# Patient Record
Sex: Female | Born: 1970 | Race: White | Hispanic: Yes | Marital: Married | Smoking: Never smoker
Health system: Southern US, Community
[De-identification: ages and names within clinical notes are randomized; demographics above are authoritative.]

## PROBLEM LIST (undated history)

## (undated) DIAGNOSIS — D649 Anemia, unspecified: Secondary | ICD-10-CM

## (undated) DIAGNOSIS — Z789 Other specified health status: Secondary | ICD-10-CM

## (undated) HISTORY — DX: Anemia, unspecified: D64.9

## (undated) HISTORY — PX: NO PAST SURGERIES: SHX2092

---

## 2006-12-27 ENCOUNTER — Ambulatory Visit: Payer: Self-pay | Admitting: Family Medicine

## 2007-01-19 ENCOUNTER — Ambulatory Visit: Payer: Self-pay | Admitting: Family Medicine

## 2007-02-10 ENCOUNTER — Encounter (INDEPENDENT_AMBULATORY_CARE_PROVIDER_SITE_OTHER): Payer: Self-pay | Admitting: Family Medicine

## 2007-02-10 ENCOUNTER — Ambulatory Visit: Payer: Self-pay | Admitting: Family Medicine

## 2007-03-23 ENCOUNTER — Other Ambulatory Visit: Admission: RE | Admit: 2007-03-23 | Discharge: 2007-03-23 | Payer: Self-pay | Admitting: Obstetrics and Gynecology

## 2007-03-23 ENCOUNTER — Ambulatory Visit: Payer: Self-pay | Admitting: Obstetrics and Gynecology

## 2007-05-19 ENCOUNTER — Ambulatory Visit: Payer: Self-pay | Admitting: Internal Medicine

## 2007-12-26 ENCOUNTER — Ambulatory Visit: Payer: Self-pay | Admitting: Family Medicine

## 2008-05-14 ENCOUNTER — Ambulatory Visit: Payer: Self-pay | Admitting: Family Medicine

## 2009-03-27 ENCOUNTER — Ambulatory Visit: Payer: Self-pay | Admitting: Family Medicine

## 2009-03-27 ENCOUNTER — Encounter (INDEPENDENT_AMBULATORY_CARE_PROVIDER_SITE_OTHER): Payer: Self-pay | Admitting: Family Medicine

## 2011-02-09 NOTE — Group Therapy Note (Signed)
NAMELORRENA, GORANSON          ACCOUNT NO.:  [id number]   MEDICAL RECORD NO.:  [id number]          PATIENT TYPE:  WOC   LOCATION:  [facility]                   FACILITY:  [facility]   PHYSICIAN:  Argentina Donovan, MD        DATE OF BIRTH:  03/04/1971   DATE OF SERVICE:                                  CLINIC NOTE   CHIEF COMPLAINT:  Evaluation of large endocervical polyp.   HISTORY OF PRESENT ILLNESS:  The patient was seen at [facility] on Feb 10, 2007 for a pap smear and physical exam. At the time of her physical  she was noted to have a large cervical polyp or mass. She was referred  to a gynecology clinic for evaluation of this endocervical polyp or  mass. Patient reports she has had right adnexal pain for years which  usually is described as dull, however she had 1 episode in January and 1  episode in April which the pain became sharp, and resolved  spontaneously. Patient denies any irregular bleeding and states that she  had regular periods with her last menstrual period occurring on March 11, 2007. She denies having any gynecologic problems or surgeries. She is  not currently, nor ever has taken oral contraception.   PAST MEDICAL HISTORY:  Significant for anemia for which she takes iron.   GYNECOLOGICAL HISTORY:  Patient is a gravida 3, para 3 with 3 term  pregnancies and no complications.   FAMILY HISTORY:  Significant for cancer unknown type, her mother passed  away at the age of 70. Also significant for maternal uncles with  diabetes.   SOCIAL HISTORY:  Patient lives with her husband and 3 children. She  denies tobacco, alcohol, or drug use.   PHYSICAL EXAMINATION:  VITAL SIGNS: Temperature 98, pulse 84, blood  pressure 118/81, weight 138.2 pounds, height 5 feet 1 inches.  PELVIC EXAM: External genitalia within normal limits. Cervix identified  with approximately 1.5 cm polyp protruding from the os. Polyp was  removed using ring forceps, and twisting. On bimanual exam  patient had  no adnexal tenderness, nor fullness.   ASSESSMENT/PLAN:  The patient is a 40 year old gravida 3, para 3 who  presents for evaluation of a large endocervical polyp.  Cervical polyp removed as above. Polyp will be sent to pathology.  Patient will be notified of pathology results. If patient needs to  return she will be notified at that time. Patient advised she will have  some spotting and possibly cramping following this procedure. Patient  tolerated procedure well and there were no apparent complications.           ______________________________  Argentina Donovan, MD    PR/MEDQ  D:  03/23/2007  T:  03/23/2007  Job:  [id number]

## 2011-12-16 ENCOUNTER — Other Ambulatory Visit: Payer: Self-pay | Admitting: Geriatric Medicine

## 2011-12-22 ENCOUNTER — Other Ambulatory Visit: Payer: Self-pay | Admitting: Geriatric Medicine

## 2011-12-22 ENCOUNTER — Ambulatory Visit
Admission: RE | Admit: 2011-12-22 | Discharge: 2011-12-22 | Disposition: A | Discharge status: Home / Self Care | Payer: No Typology Code available for payment source | Source: Ambulatory Visit | Attending: Geriatric Medicine | Admitting: Geriatric Medicine

## 2011-12-22 IMAGING — US US ABDOMEN LIMITED
1 series · 14 of 25 positions shown · non-contrast
Comparison: None.

CLINICAL DATA: Positive Murphy's sign on physical exam.

LIMITED ABDOMINAL ULTRASOUND - RIGHT UPPER QUADRANT

[Series 1: us abdomen limited · 0.24mm/px · 14 of 47 slices shown]
[im 1/47]
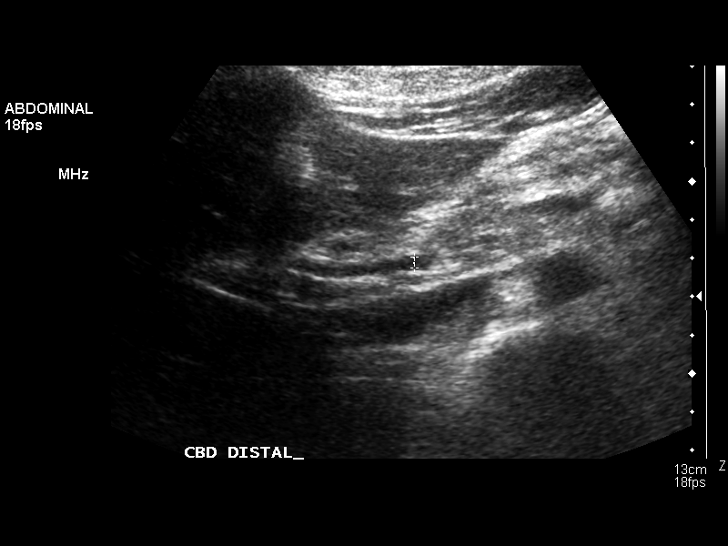
[im 4/47]
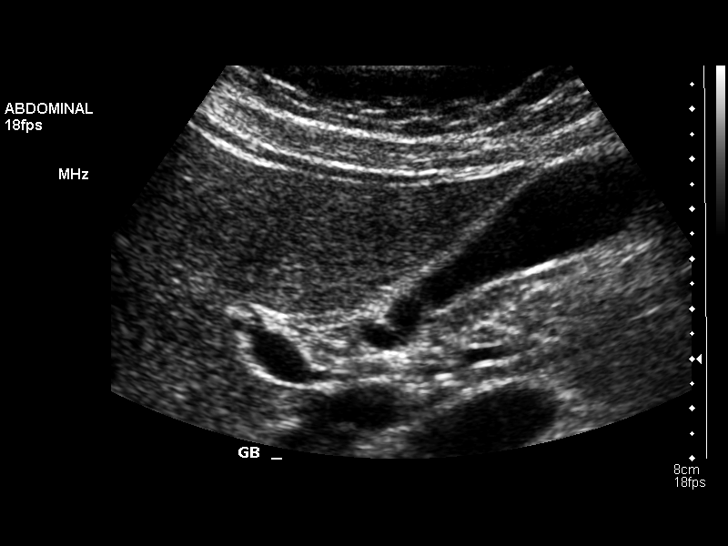
[im 8/47]
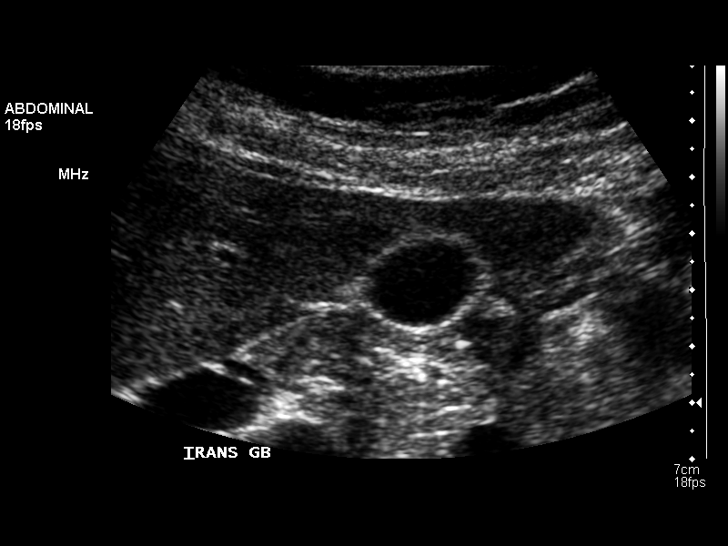
[im 12/47]
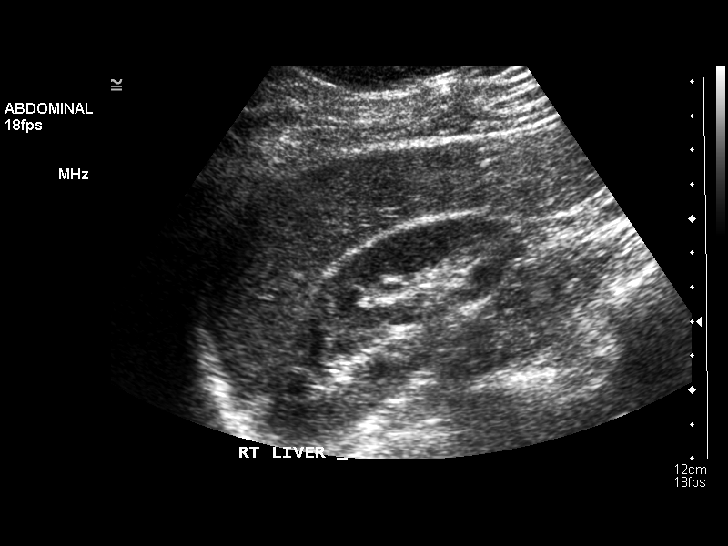
[im 16/47]
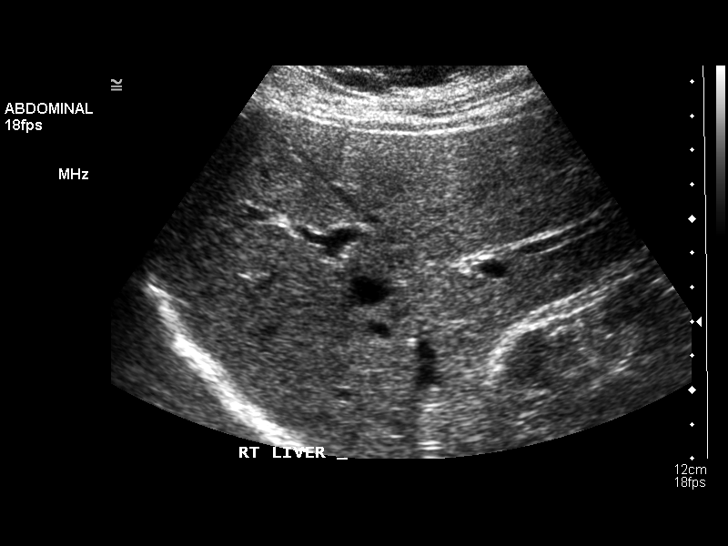
[im 18/47]
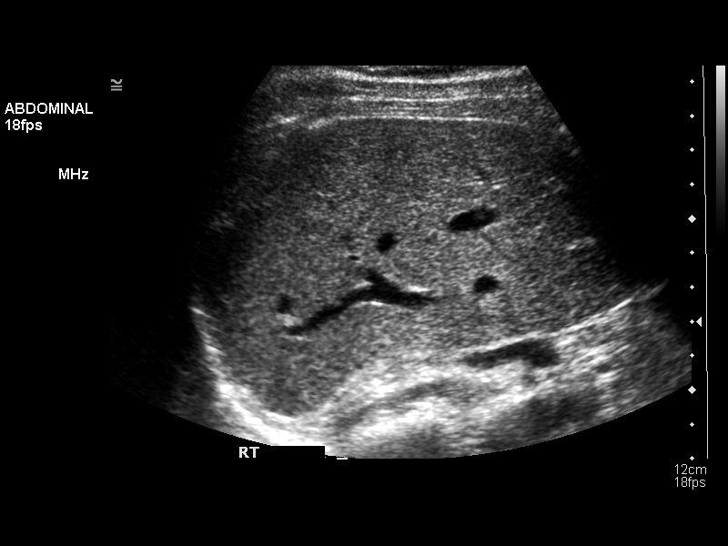
[im 22/47]
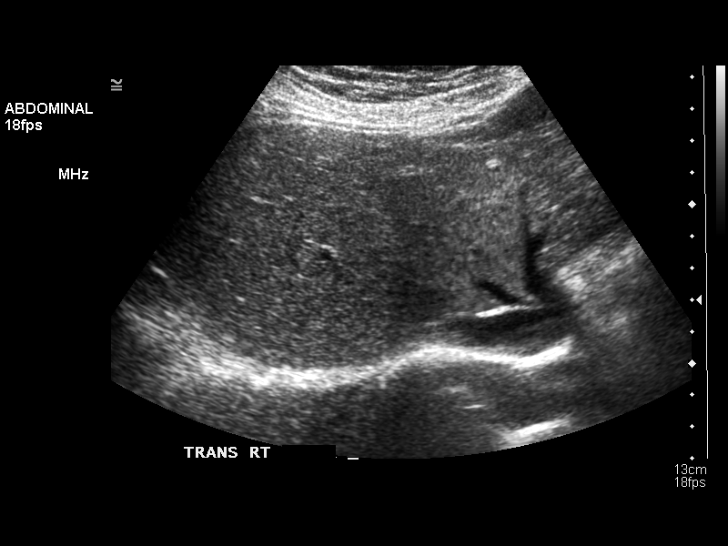
[im 25/47]
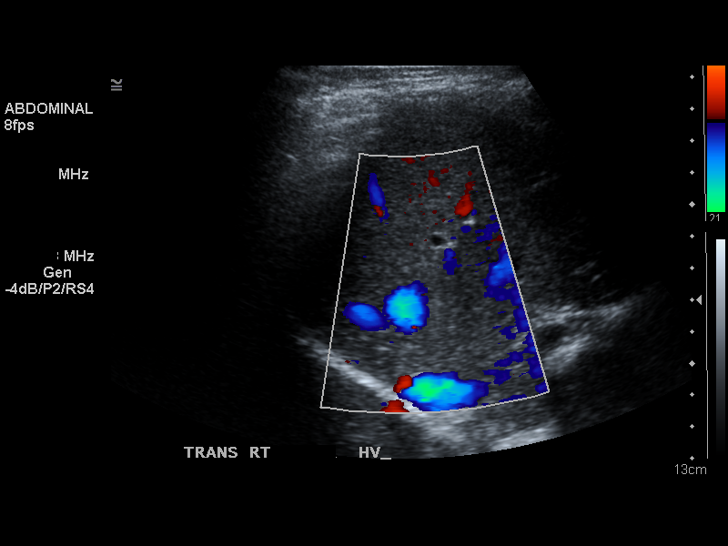
[im 29/47]
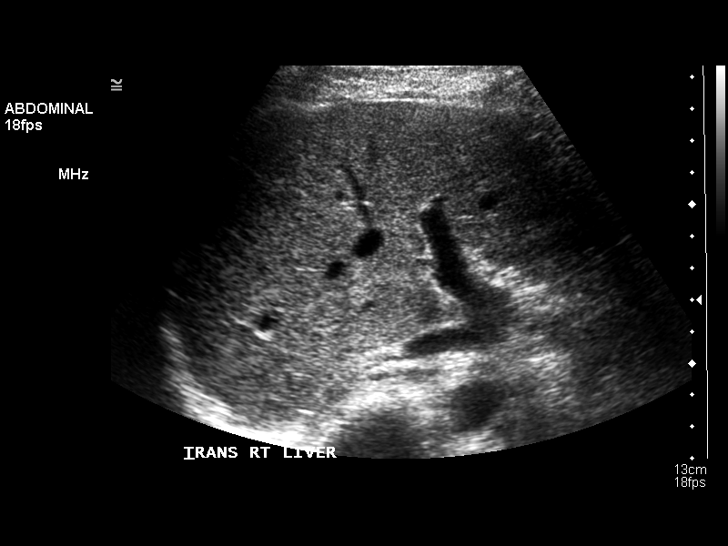
[im 31/47]
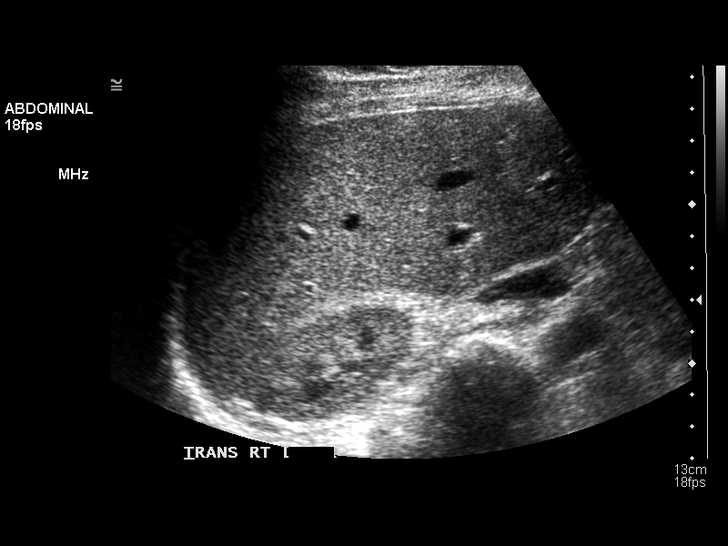
[im 35/47]
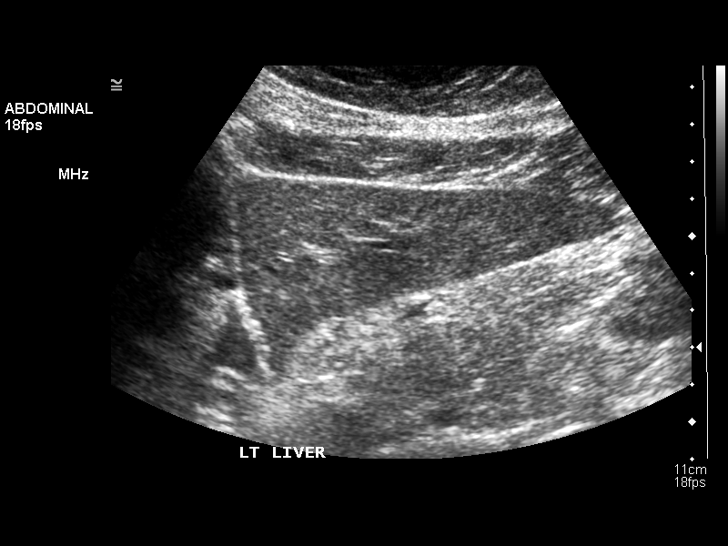
[im 39/47]
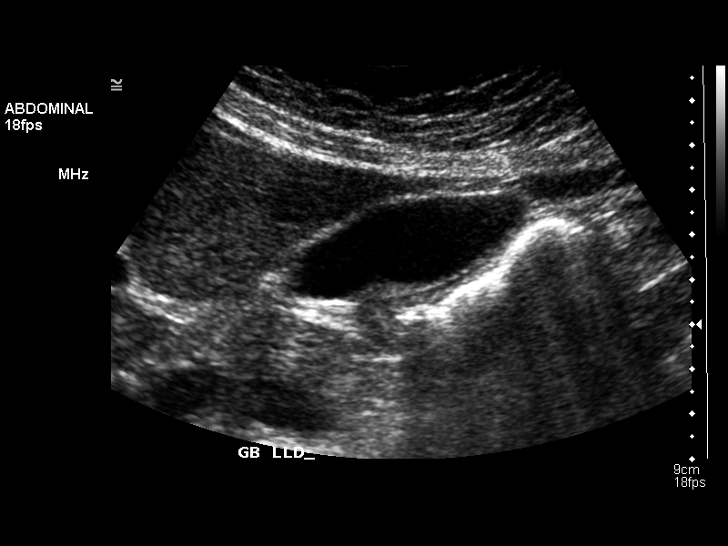
[im 43/47]
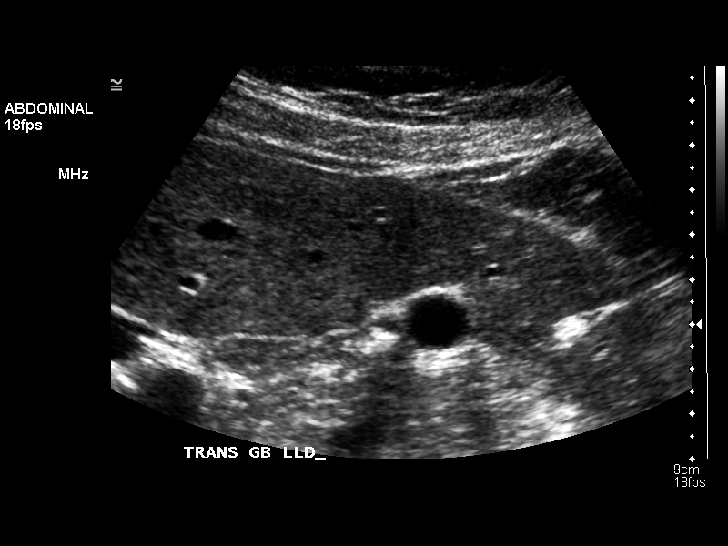
[im 47/47]
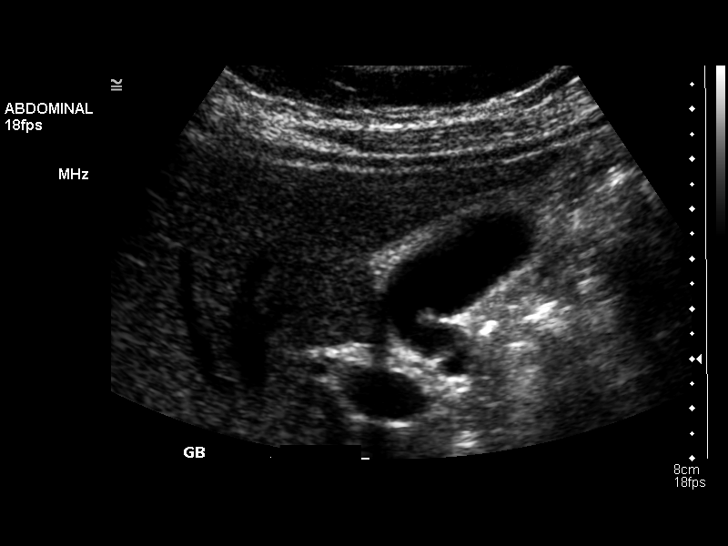

[14 of 25 positions shown; findings below may reference images not displayed]

FINDINGS: Gallbladder:  No stones or wall thickening.  Negative sonographic
Friptu.

Common bile duct:  Normal caliber, 3 mm.

Liver:  Normal size and echotexture.  No focal abnormality.
IMPRESSION: Unremarkable right upper quadrant ultrasound.

## 2014-09-27 NOTE — L&D Delivery Note (Signed)
Delivery Note At 02:16 PM a viable female was delivered via Vaginal, Spontaneous Delivery (Presentation: VERTEX). APGAR: 9, 9; weight 7-14.  Placenta status: Intact, Spontaneous. Cord: 3 vessels with the following complications: None. Cord pH: NA.  Anesthesia: Epidural  Episiotomy: None Lacerations: None Suture Repair: N/A Est. Blood Loss (mL): 258 ml   Mom to postpartum. Baby to Couplet care / Skin to Skin. Placenta to: Birthing Suites Feeding: Breast & Bottle Circ: NA Contraception: IUD

## 2014-12-02 ENCOUNTER — Other Ambulatory Visit (HOSPITAL_COMMUNITY): Payer: Self-pay | Admitting: Nurse Practitioner

## 2014-12-02 DIAGNOSIS — Z3689 Encounter for other specified antenatal screening: Secondary | ICD-10-CM

## 2014-12-02 LAB — OB RESULTS CONSOLE HIV ANTIBODY (ROUTINE TESTING): HIV: NONREACTIVE

## 2014-12-02 LAB — OB RESULTS CONSOLE RUBELLA ANTIBODY, IGM: Rubella: IMMUNE

## 2014-12-02 LAB — OB RESULTS CONSOLE RPR: RPR: NONREACTIVE

## 2014-12-02 LAB — OB RESULTS CONSOLE ANTIBODY SCREEN: ANTIBODY SCREEN: NEGATIVE

## 2014-12-02 LAB — OB RESULTS CONSOLE ABO/RH: RH Type: POSITIVE

## 2014-12-02 LAB — OB RESULTS CONSOLE HEPATITIS B SURFACE ANTIGEN: Hepatitis B Surface Ag: NEGATIVE

## 2014-12-02 LAB — OB RESULTS CONSOLE GC/CHLAMYDIA
Chlamydia: NEGATIVE
Gonorrhea: NEGATIVE

## 2014-12-17 ENCOUNTER — Ambulatory Visit (HOSPITAL_COMMUNITY): Payer: Self-pay

## 2014-12-19 ENCOUNTER — Ambulatory Visit (HOSPITAL_COMMUNITY)
Admission: RE | Admit: 2014-12-19 | Discharge: 2014-12-19 | Disposition: A | Discharge status: Home / Self Care | Payer: Medicaid Other | Source: Ambulatory Visit | Attending: Nurse Practitioner | Admitting: Nurse Practitioner

## 2014-12-19 ENCOUNTER — Encounter (HOSPITAL_COMMUNITY): Payer: Self-pay

## 2014-12-19 ENCOUNTER — Other Ambulatory Visit (HOSPITAL_COMMUNITY): Payer: Self-pay | Admitting: Nurse Practitioner

## 2014-12-19 ENCOUNTER — Other Ambulatory Visit (HOSPITAL_COMMUNITY): Payer: Self-pay | Admitting: Obstetrics & Gynecology

## 2014-12-19 DIAGNOSIS — O09522 Supervision of elderly multigravida, second trimester: Secondary | ICD-10-CM | POA: Diagnosis not present

## 2014-12-19 DIAGNOSIS — Z36 Encounter for antenatal screening of mother: Secondary | ICD-10-CM | POA: Insufficient documentation

## 2014-12-19 DIAGNOSIS — Z3689 Encounter for other specified antenatal screening: Secondary | ICD-10-CM

## 2014-12-19 DIAGNOSIS — O0932 Supervision of pregnancy with insufficient antenatal care, second trimester: Secondary | ICD-10-CM | POA: Insufficient documentation

## 2014-12-19 DIAGNOSIS — O09529 Supervision of elderly multigravida, unspecified trimester: Secondary | ICD-10-CM | POA: Insufficient documentation

## 2014-12-19 IMAGING — US US OB DETAIL+14 WK
1 series · 12 of 28 positions shown · non-contrast
Comparison: none

[Series 1: us ob detail+14 wk · 0.26mm/px · 12 of 71 slices shown]
[im 3/71]
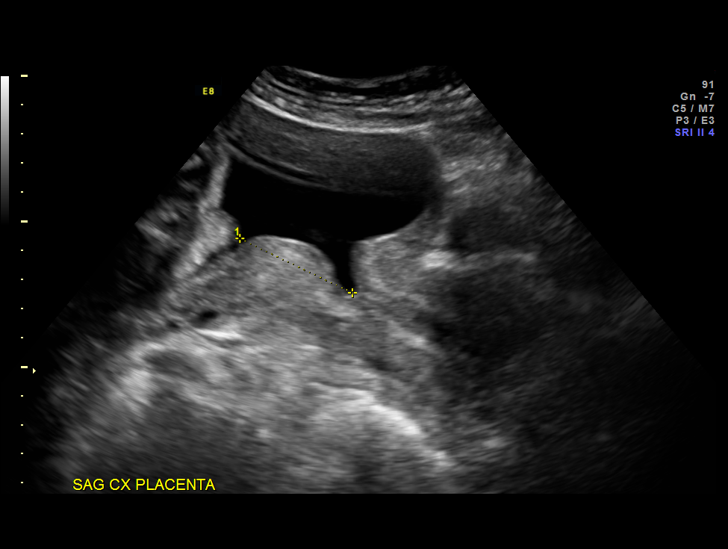
[im 8/71]
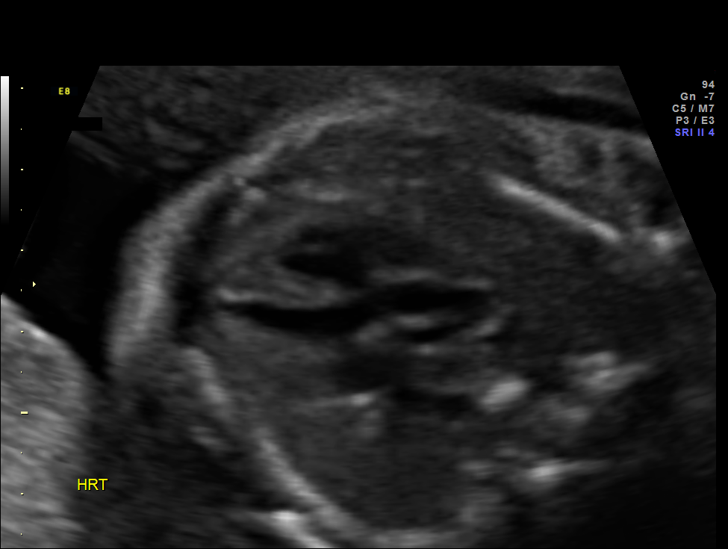
[im 13/71]
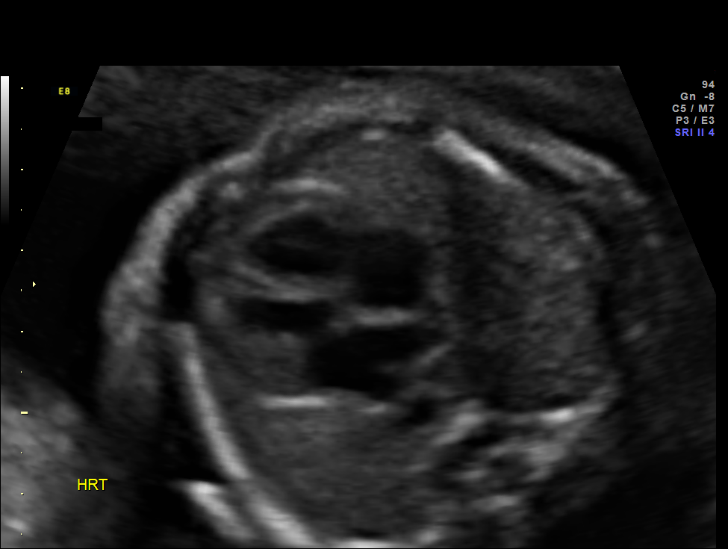
[im 21/71]
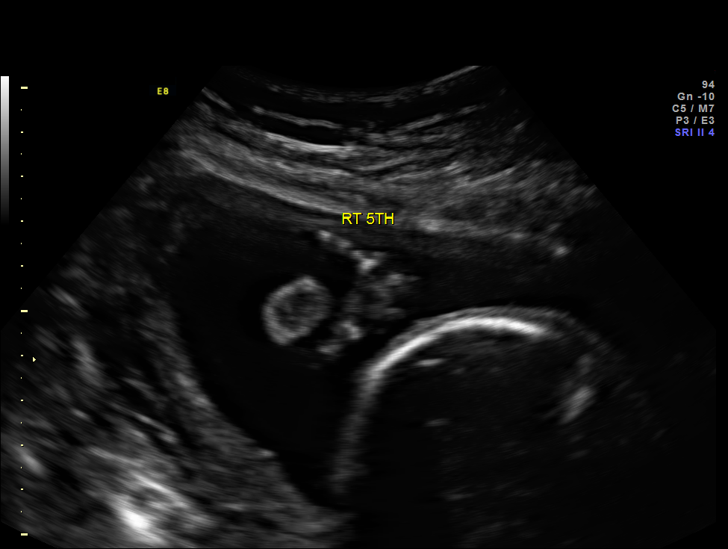
[im 26/71]
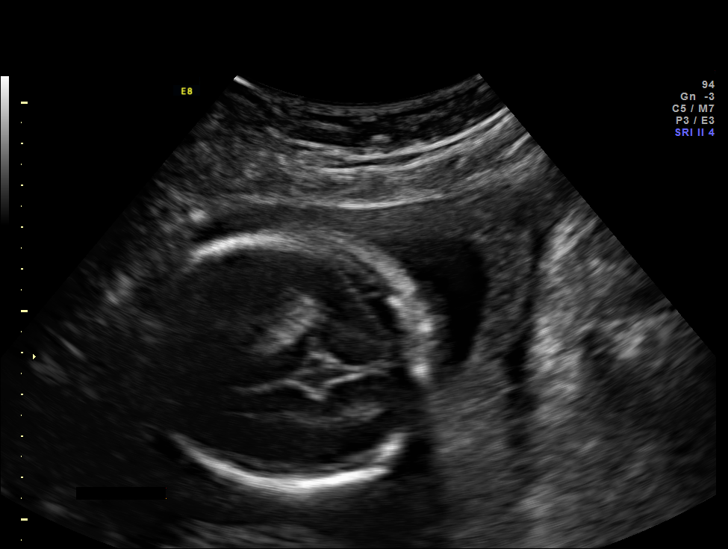
[im 32/71]
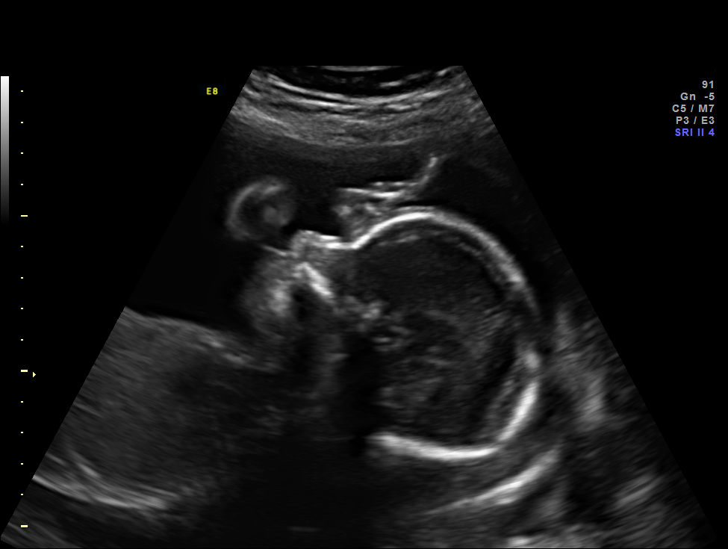
[im 39/71]
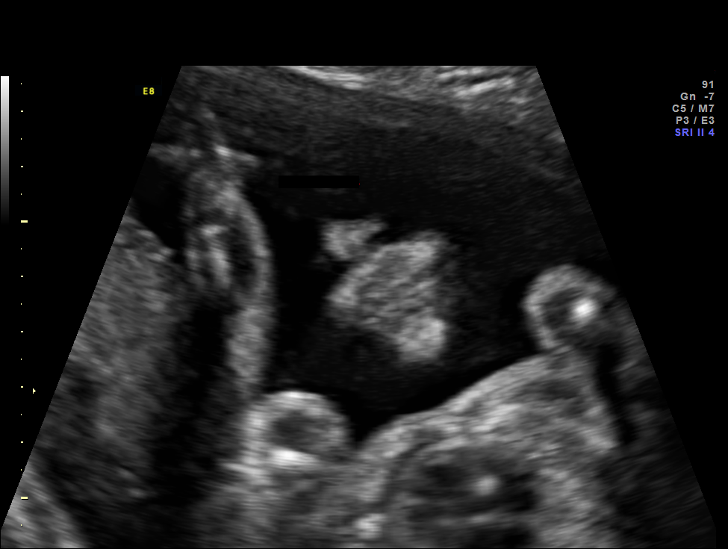
[im 45/71]
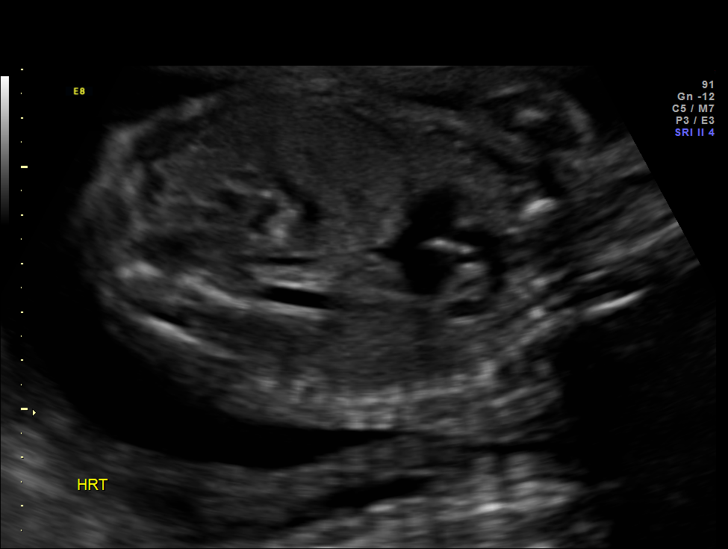
[im 50/71]
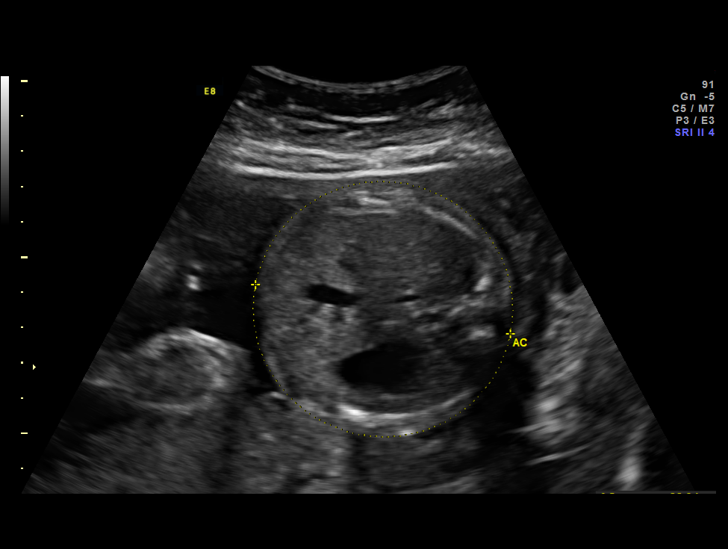
[im 58/71]
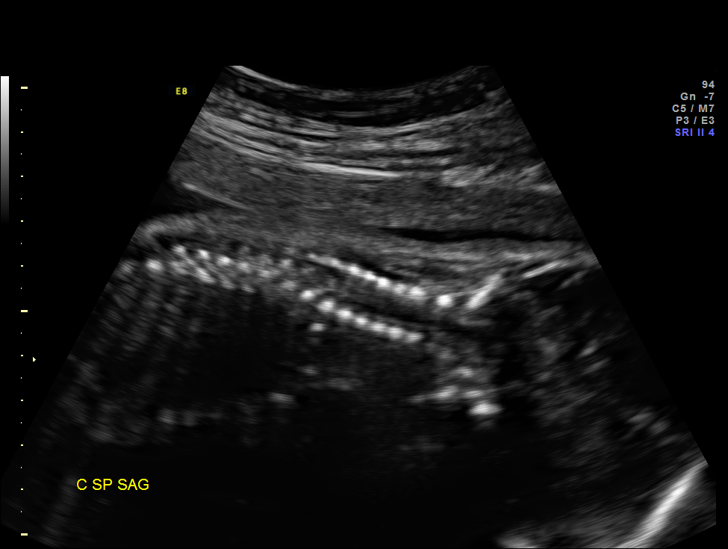
[im 63/71]
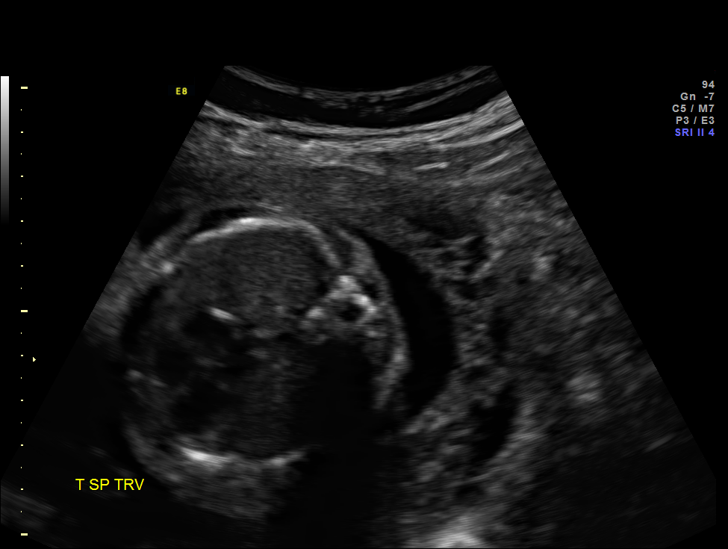
[im 68/71]
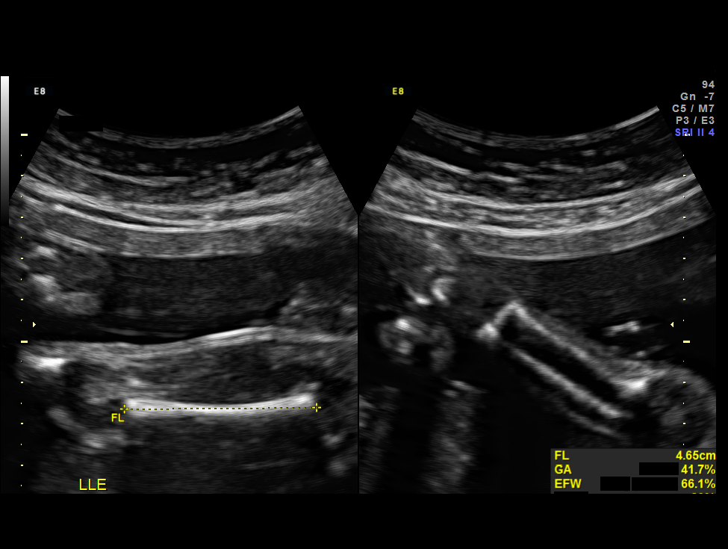

[12 of 28 positions shown; findings below may reference images not displayed]

OBSTETRICS REPORT
                      (Signed Final [DATE] [DATE])

Service(s) Provided

 US OB DETAIL + 14 WK                                  76811.0
Indications

 25 weeks gestation of pregnancy
 Detailed fetal anatomic survey                        Z36
 Advanced maternal age multigravida 35+, second        [G4]
 trimester
 No or Little Prenatal Care                            [G4]
Fetal Evaluation

 Num Of Fetuses:    1
 Fetal Heart Rate:  155                          bpm
 Cardiac Activity:  Observed
 Presentation:      Cephalic
 Placenta:          Posterior, above cervical
                    os
 P. Cord            Visualized, central
 Insertion:

 Amniotic Fluid
 AFI FV:      Subjectively within normal limits
                                             Larg Pckt:     6.0  cm
Biometry

 BPD:     62.5  mm     G. Age:  25w 2d                CI:         75.5   70 - 86
 OFD:     82.8  mm                                    FL/HC:      19.9   18.7 -

 HC:     233.7  mm     G. Age:  25w 3d       32  %    HC/AC:      1.02   1.04 -

 AC:     228.2  mm     G. Age:  27w 2d       91  %    FL/BPD:     74.6   71 - 87
 FL:      46.6  mm     G. Age:  25w 4d       43  %    FL/AC:      20.4   20 - 24
 HUM:     42.4  mm     G. Age:  25w 3d       48  %
 CER:     29.5  mm     G. Age:  26w 2d       67  %

 Est. FW:     921  gm           2 lb     74  %
Gestational Age

 LMP:           25w 2d        Date:  [DATE]                 EDD:   [DATE]
 U/S Today:     25w 6d                                        EDD:   [DATE]
 Best:          25w 2d     Det. By:  LMP  ([DATE])          EDD:   [DATE]
Anatomy

 Cranium:          Appears normal         Aortic Arch:      Appears normal
 Fetal Cavum:      Appears normal         Ductal Arch:      Appears normal
 Ventricles:       Appears normal         Diaphragm:        Appears normal
 Choroid Plexus:   Appears normal         Stomach:          Appears normal, left
                                                            sided
 Cerebellum:       Appears normal         Abdomen:          Appears normal
 Posterior Fossa:  Appears normal         Abdominal Wall:   Appears nml (cord
                                                            insert, abd wall)
 Nuchal Fold:      Not applicable (>20    Cord Vessels:     Appears normal (3
                   wks GA)                                  vessel cord)
 Face:             Appears normal         Kidneys:          Appear normal
                   (orbits and profile)
 Lips:             Appears normal         Bladder:          Appears normal
 Heart:            Appears normal         Spine:            Appears normal
                   (4CH, axis, and
                   situs)
 RVOT:             Appears normal         Lower             Appears normal
                                          Extremities:
 LVOT:             Appears normal         Upper             Appears normal
                                          Extremities:

 Other:  Fetus appears to be a female. Heels and 5th digit visualized. Nasal
         bone visualized.
Targeted Anatomy

 Fetal Central Nervous System
 Cisterna Magna:
Cervix Uterus Adnexa

 Cervical Length:    4.4      cm

 Cervix:       Normal appearance by transabdominal scan.

 Left Ovary:    Within normal limits.
 Right Ovary:   Within normal limits.
 Adnexa:     No abnormality visualized.
Impression

 Single IUP at 25w 2d
 Advanced maternal age > 40
 Normal fetal anatomic survey
 Fetal growth is appropriate (74th %tile)
 Normal amniotic fluid volume
Recommendations

 Recommend follow-up ultrasound examination in 6 weeks for
 growth due to advanced maternal age
 Antenatal testing beginning at 36 weeks
 Recommend delivery by EDD

## 2014-12-19 NOTE — Progress Notes (Addendum)
Genetic Counseling  High-Risk Gestation Note  Appointment Date:  12/19/2014 Referred By: Trina AoBaker, Sandra K, NP Date of Birth:  [birth year]   Pregnancy History: [id number] Estimated Date of Delivery: 04/01/15 Estimated Gestational Age: 375w2d Attending: Alpha GulaPaul Whitecar, MD   Danielle Cole was seen for genetic counseling because of a maternal age of 44 y.o..  [facility]English medical interpreter, Olegario MessierKathy, provided interpretation for today's visit.   She was counseled regarding maternal age and the association with risk for chromosome conditions due to nondisjunction with aging of the ova.   We reviewed chromosomes, nondisjunction, and the associated 1 in 2431 risk for fetal aneuploidy related to a maternal age of 44 y.o. at delivery.  She was counseled that the risk for aneuploidy decreases as gestational age increases, accounting for those pregnancies which spontaneously abort.  We specifically discussed Down syndrome (trisomy 8321), trisomies 6613 and 5318, and sex chromosome aneuploidies (47,XXX and 47,XXY) including the common features and prognoses of each.   We reviewed available screening options including noninvasive prenatal screening (NIPS)/cell free DNA (cfDNA) testing and detailed ultrasound.  She was counseled that screening tests are used to modify a patient's a priori risk for aneuploidy, typically based on age. This estimate provides a pregnancy specific risk assessment. We reviewed the benefits and limitations of each option. Specifically, we discussed the conditions for which each test screens, the detection rates, and false positive rates of each. She was also counseled regarding diagnostic testing via amniocentesis. We reviewed the approximate 1 in 300-500 risk for complications for amniocentesis, including spontaneous preterm labor and delivery. After consideration of all the options, she declined NIPS and amniocentesis.   A detailed ultrasound was performed today. The ultrasound report  will be sent under separate cover. There were no visualized fetal anomalies or markers suggestive of aneuploidy. Diagnostic testing was declined today.  She understands that screening tests cannot rule out all birth defects or genetic syndromes. The patient was advised of this limitation and states she still does not want additional testing at this time.   Danielle Cole was provided with written information regarding sickle cell anemia (SCA) including the carrier frequency and incidence in the Hispanic population, the availability of carrier testing and prenatal diagnosis if indicated.  In addition, we discussed that hemoglobinopathies are routinely screened for as part of the [place] newborn screening panel.  She previously had hemoglobin electrophoresis, which indicated the presence of normal adult hemoglobin.  Both family histories were reviewed and found to be contributory for Down syndrome for the nephew of the father of the pregnancy (his sister's son). She reportedly was told during her pregnancy that he had a hole in his heart and was likely going to have Down syndrome. This relative is currently 43 years old and does not speak. He was described to have facial features similar to other individuals with Down syndrome, particularly with his eyes. He was described to be a late walker and is in special classes in school. He did have a hole in the heart but did not require surgical correction. We discussed that 95% of cases of Down syndrome are not inherited and are the result of non-disjunction.  Three to 4% of cases of Down syndrome are the result of a translocation involving chromosome #21.  We discussed the option of chromosome analysis to determine if an individual is a carrier of a balanced translocation involving chromosome #21.  If an individual carries a balanced translocation involving chromosome #21, then the chance to have  a baby with Down syndrome would be greater than the maternal  age-related risk.  Given the reported family history, we discussed that this relative's Down syndrome was most likely sporadic. Additional information regarding the etiology may alter recurrence risk assessment.   Additionally, Danielle Cole reported that her oldest sister had a daughter who was not able to walk. This daughter reportedly got sick and died at age 26 years. Limited information was known by the patient regarding her condition. The patient reported that this sister has 10 additional children who are all healthy. We discussed that without further information regarding the provided family history, an accurate genetic risk cannot be calculated. However, given the reported family history, recurrence risk for the current pregnancy for something similar is likely low. Further genetic counseling is warranted if more information is obtained.  Danielle Cole denied exposure to environmental toxins or chemical agents. She denied the use of alcohol, tobacco or street drugs. She denied significant viral illnesses during the course of her pregnancy. Her medical and surgical histories were noncontributory.   I counseled Danielle Cole regarding the above risks and available options.  The approximate face-to-face time with the genetic counselor was 45 minutes.  Quinn Plowman, MS,  Certified Genetic Counselor 12/19/2014

## 2014-12-23 ENCOUNTER — Other Ambulatory Visit (HOSPITAL_COMMUNITY): Payer: Self-pay | Admitting: Maternal and Fetal Medicine

## 2014-12-23 DIAGNOSIS — O0933 Supervision of pregnancy with insufficient antenatal care, third trimester: Secondary | ICD-10-CM

## 2014-12-23 DIAGNOSIS — O09523 Supervision of elderly multigravida, third trimester: Secondary | ICD-10-CM

## 2015-01-30 ENCOUNTER — Other Ambulatory Visit (HOSPITAL_COMMUNITY): Payer: Self-pay | Admitting: Maternal and Fetal Medicine

## 2015-01-30 ENCOUNTER — Ambulatory Visit (HOSPITAL_COMMUNITY)
Admission: RE | Admit: 2015-01-30 | Discharge: 2015-01-30 | Disposition: A | Discharge status: Home / Self Care | Payer: Medicaid Other | Source: Ambulatory Visit | Attending: Nurse Practitioner | Admitting: Nurse Practitioner

## 2015-01-30 DIAGNOSIS — O09523 Supervision of elderly multigravida, third trimester: Secondary | ICD-10-CM | POA: Insufficient documentation

## 2015-01-30 DIAGNOSIS — O0933 Supervision of pregnancy with insufficient antenatal care, third trimester: Secondary | ICD-10-CM

## 2015-01-30 DIAGNOSIS — Z3A31 31 weeks gestation of pregnancy: Secondary | ICD-10-CM | POA: Insufficient documentation

## 2015-02-27 ENCOUNTER — Ambulatory Visit (HOSPITAL_COMMUNITY)
Admission: RE | Admit: 2015-02-27 | Discharge: 2015-02-27 | Disposition: A | Discharge status: Home / Self Care | Payer: Medicaid Other | Source: Ambulatory Visit | Attending: Nurse Practitioner | Admitting: Nurse Practitioner

## 2015-02-27 ENCOUNTER — Encounter (HOSPITAL_COMMUNITY): Payer: Self-pay

## 2015-02-27 VITALS — BP 123/79 | HR 82 | Wt 161.8 lb

## 2015-02-27 DIAGNOSIS — Z3A35 35 weeks gestation of pregnancy: Secondary | ICD-10-CM | POA: Insufficient documentation

## 2015-02-27 DIAGNOSIS — O09523 Supervision of elderly multigravida, third trimester: Secondary | ICD-10-CM | POA: Insufficient documentation

## 2015-02-27 DIAGNOSIS — O0933 Supervision of pregnancy with insufficient antenatal care, third trimester: Secondary | ICD-10-CM

## 2015-02-27 HISTORY — DX: Other specified health status: Z78.9

## 2015-02-27 IMAGING — US US OB FOLLOW-UP
1 series · 12 of 28 positions shown · non-contrast
Comparison: none

[Series 1: us ob follow-up · 0.28mm/px · 12 of 31 slices shown]
[im 2/31]
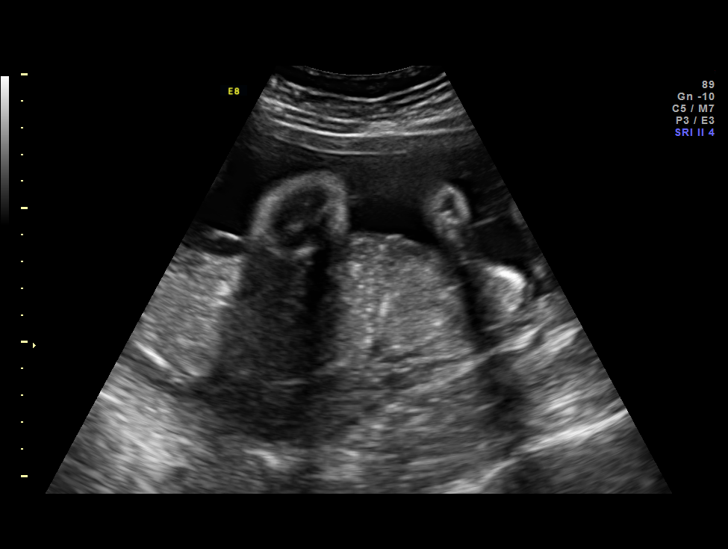
[im 4/31]
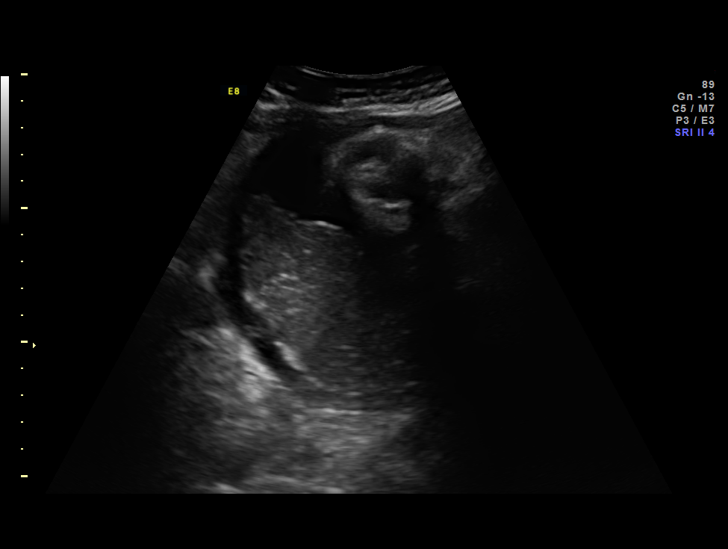
[im 6/31]
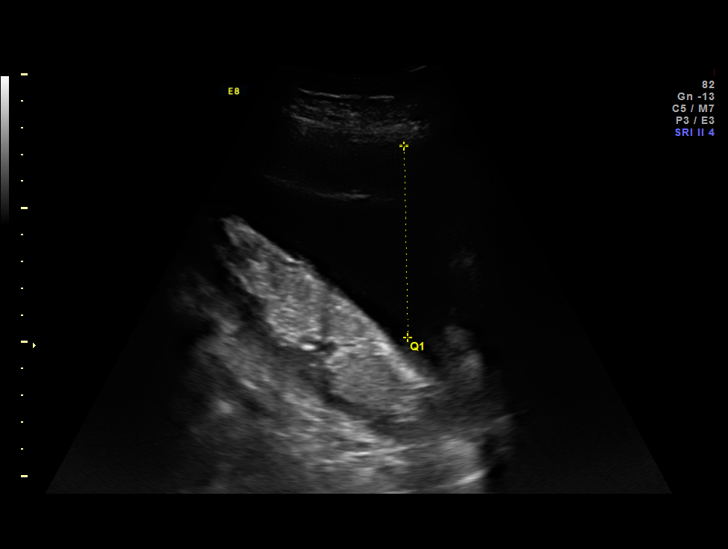
[im 9/31]
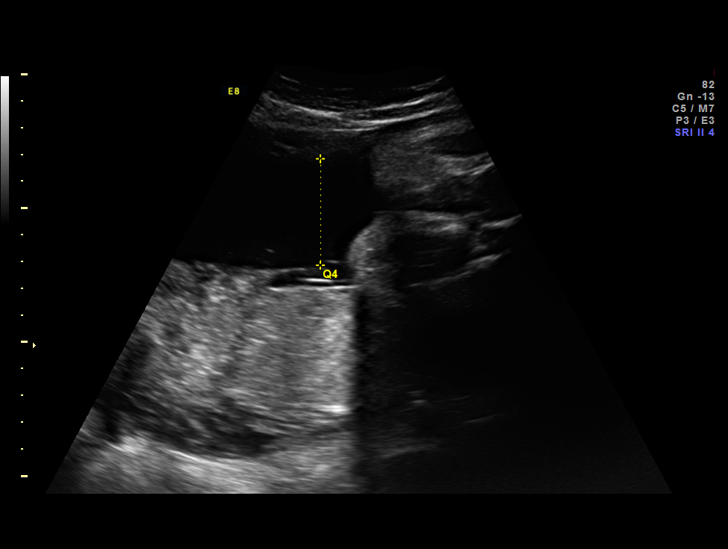
[im 12/31]
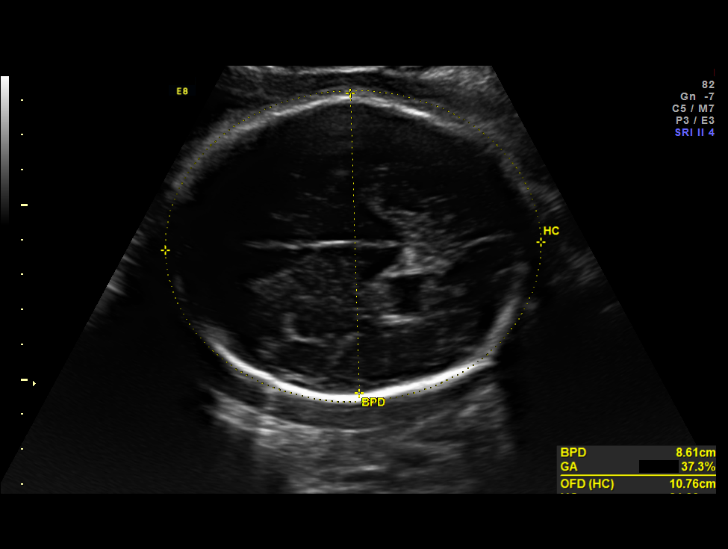
[im 14/31]
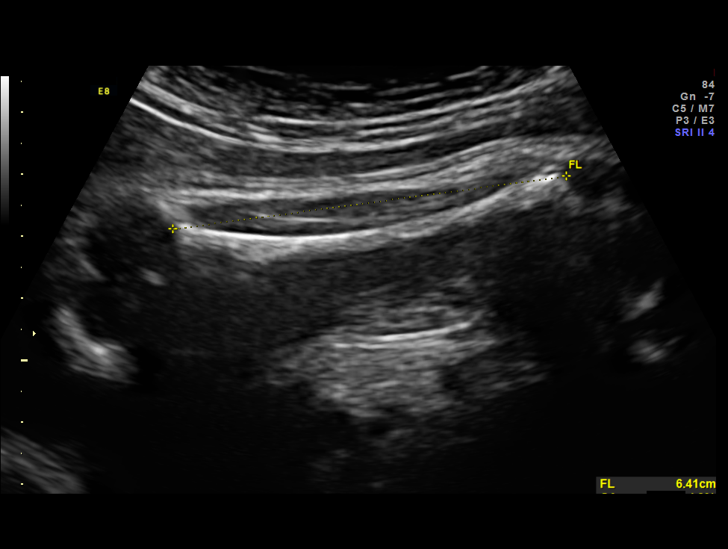
[im 17/31]
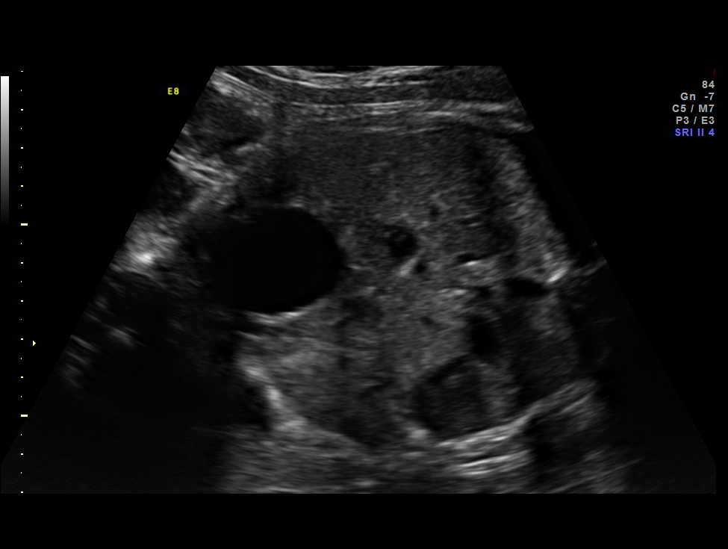
[im 19/31]
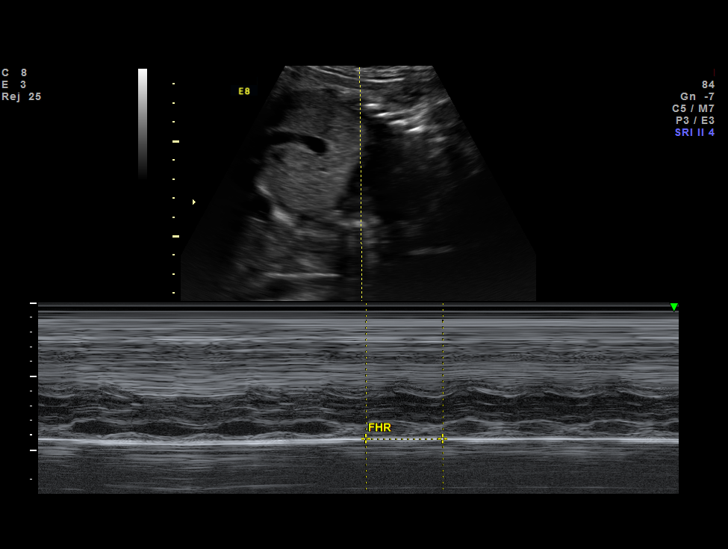
[im 22/31]
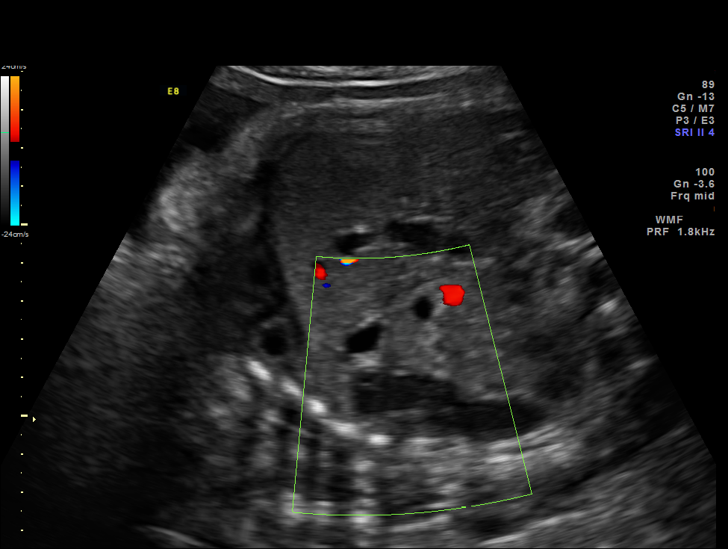
[im 25/31]
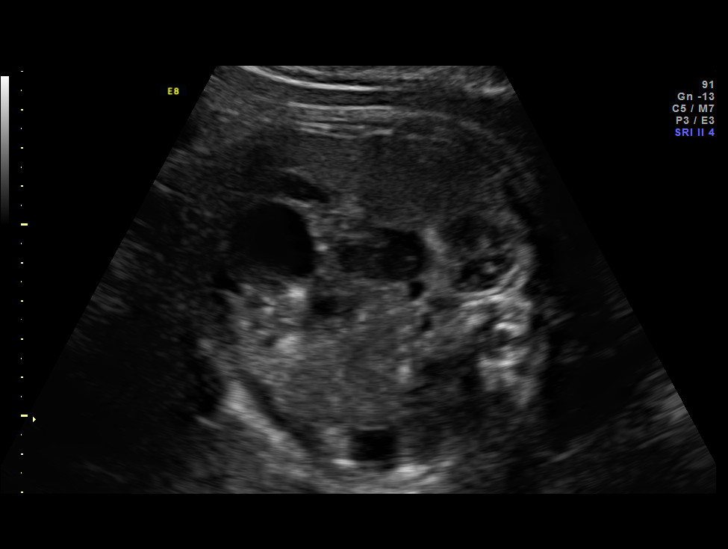
[im 27/31]
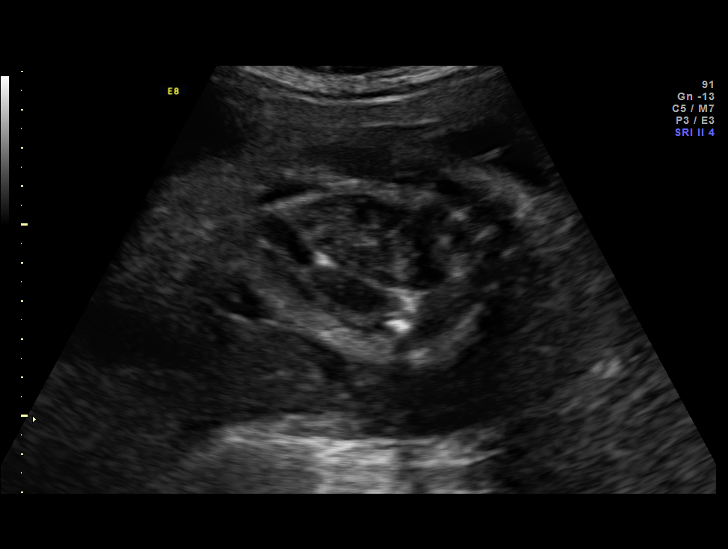
[im 29/31]
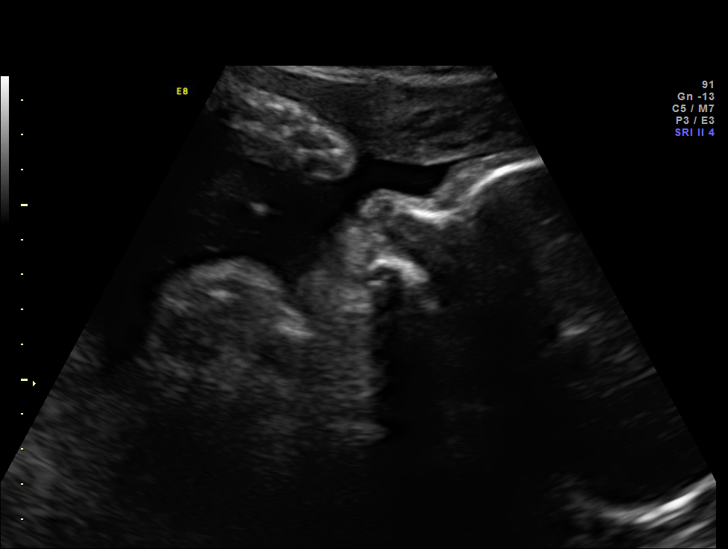

[12 of 28 positions shown; findings below may reference images not displayed]

OBSTETRICS REPORT
(Signed Final 02/27/2015 [DATE])

Service(s) Provided

US OB FOLLOW UP                                       76816.1
Indications

Advanced maternal age multigravida 35+, third
trimester
No or Little Prenatal Care
35 weeks gestation of pregnancy
Fetal Evaluation

Num Of Fetuses:    1
Fetal Heart Rate:  131                          bpm
Cardiac Activity:  Observed
Presentation:      Cephalic
Placenta:          Posterior, above cervical
os
P. Cord            Previously Visualized
Insertion:

Amniotic Fluid
AFI FV:      Subjectively within normal limits
AFI Sum:     18      cm       67  %Tile     Larg Pckt:    7.16  cm
RUQ:   7.16    cm   RLQ:    3.98   cm    LUQ:   5.02    cm   LLQ:    1.84   cm
Biometry

BPD:     85.9  mm     G. Age:  34w 5d                CI:         78.9   70 - 86
OFD:    108.9  mm                                    FL/HC:      20.6   20.1 -
22.3
HC:     311.2  mm     G. Age:  34w 6d       11  %    HC/AC:      0.92   0.93 -
1.11
AC:     340.1  mm     G. Age:  37w 6d     > 97  %    FL/BPD:     74.7   71 - 87
FL:      64.2  mm     G. Age:  33w 1d        5  %    FL/AC:      18.9   20 - 24
HUM:     57.9  mm     G. Age:  33w 4d       32  %

Est. FW:    0877  gm      6 lb 4 oz     77  %
Gestational Age

LMP:           35w 2d        Date:  06/25/14                 EDD:   04/01/15
U/S Today:     35w 1d                                        EDD:   04/02/15
Best:          35w 2d     Det. By:  LMP  (06/25/14)          EDD:   04/01/15
Anatomy

Cranium:          Previously seen        Aortic Arch:      Previously seen
Fetal Cavum:      Previously seen        Ductal Arch:      Previously seen
Ventricles:       Appears normal         Diaphragm:        Appears normal
Choroid Plexus:   Previously seen        Stomach:          Appears normal, left
sided
Cerebellum:       Previously seen        Abdomen:          Previously seen
Posterior Fossa:  Previously seen        Abdominal Wall:   Previously seen
Nuchal Fold:      Not applicable (>20    Cord Vessels:     Previously seen
wks GA)
Face:             Orbits and profile     Kidneys:          Appear normal
previously seen
Lips:             Previously seen        Bladder:          Appears normal
Heart:            Appears normal         Spine:            Previously seen
(4CH, axis, and
situs)
RVOT:             Previously seen        Lower             Previously seen
Extremities:
LVOT:             Previously seen        Upper             Previously seen
Extremities:

Other:  Fetus appears to be a female. Heels and 5th digit previously
visualized.
Cervix Uterus Adnexa

Cervix:       Not visualized (advanced GA >73wks)
Impression

Single IUP at 35w 2d
Advanced maternal age > 40
Interval growth is appropriate (77th %tile).  The AC measures
> 97th %tile (had normal GDM screen)
Normal amniotic fluid volume
Recommendations

Recommend antenatal testing - 2x weekly NSTs with weekly
AFIs
Recommend delivery by EDD
Follow-up ultrasounds as clinically indicated.

## 2015-02-27 NOTE — ED Notes (Signed)
Spanish interpreter Mariel Gallego with pt. 

## 2015-03-07 ENCOUNTER — Other Ambulatory Visit (HOSPITAL_COMMUNITY): Payer: Self-pay | Admitting: Nurse Practitioner

## 2015-03-07 ENCOUNTER — Ambulatory Visit (HOSPITAL_COMMUNITY)
Admission: RE | Admit: 2015-03-07 | Discharge: 2015-03-07 | Disposition: A | Discharge status: Home / Self Care | Payer: Self-pay | Source: Ambulatory Visit | Attending: Nurse Practitioner | Admitting: Nurse Practitioner

## 2015-03-07 ENCOUNTER — Encounter (HOSPITAL_COMMUNITY): Payer: Self-pay

## 2015-03-07 ENCOUNTER — Ambulatory Visit (HOSPITAL_COMMUNITY): Payer: Medicaid Other

## 2015-03-07 DIAGNOSIS — O09529 Supervision of elderly multigravida, unspecified trimester: Secondary | ICD-10-CM | POA: Insufficient documentation

## 2015-03-07 DIAGNOSIS — Z3A36 36 weeks gestation of pregnancy: Secondary | ICD-10-CM | POA: Insufficient documentation

## 2015-03-07 DIAGNOSIS — O0933 Supervision of pregnancy with insufficient antenatal care, third trimester: Secondary | ICD-10-CM | POA: Insufficient documentation

## 2015-03-07 DIAGNOSIS — O09523 Supervision of elderly multigravida, third trimester: Secondary | ICD-10-CM | POA: Insufficient documentation

## 2015-03-10 LAB — OB RESULTS CONSOLE GBS: GBS: POSITIVE

## 2015-03-13 ENCOUNTER — Other Ambulatory Visit (HOSPITAL_COMMUNITY): Payer: Self-pay | Admitting: Maternal and Fetal Medicine

## 2015-03-13 DIAGNOSIS — O09523 Supervision of elderly multigravida, third trimester: Secondary | ICD-10-CM

## 2015-03-13 DIAGNOSIS — O0933 Supervision of pregnancy with insufficient antenatal care, third trimester: Secondary | ICD-10-CM

## 2015-03-14 ENCOUNTER — Ambulatory Visit (HOSPITAL_COMMUNITY): Payer: Medicaid Other

## 2015-03-18 ENCOUNTER — Other Ambulatory Visit (HOSPITAL_COMMUNITY): Payer: Self-pay | Admitting: Maternal and Fetal Medicine

## 2015-03-18 ENCOUNTER — Ambulatory Visit (HOSPITAL_COMMUNITY)
Admission: RE | Admit: 2015-03-18 | Discharge: 2015-03-18 | Disposition: A | Discharge status: Home / Self Care | Payer: Self-pay | Source: Ambulatory Visit | Attending: Nurse Practitioner | Admitting: Nurse Practitioner

## 2015-03-18 ENCOUNTER — Other Ambulatory Visit (HOSPITAL_COMMUNITY): Payer: Self-pay | Admitting: Nurse Practitioner

## 2015-03-18 DIAGNOSIS — Z3A38 38 weeks gestation of pregnancy: Secondary | ICD-10-CM | POA: Insufficient documentation

## 2015-03-18 DIAGNOSIS — O09523 Supervision of elderly multigravida, third trimester: Secondary | ICD-10-CM | POA: Insufficient documentation

## 2015-03-18 DIAGNOSIS — O0930 Supervision of pregnancy with insufficient antenatal care, unspecified trimester: Secondary | ICD-10-CM | POA: Insufficient documentation

## 2015-03-18 DIAGNOSIS — O0933 Supervision of pregnancy with insufficient antenatal care, third trimester: Secondary | ICD-10-CM | POA: Insufficient documentation

## 2015-03-21 ENCOUNTER — Other Ambulatory Visit (HOSPITAL_COMMUNITY): Payer: Self-pay | Admitting: Nurse Practitioner

## 2015-03-21 ENCOUNTER — Ambulatory Visit (HOSPITAL_COMMUNITY): Payer: Medicaid Other

## 2015-03-21 DIAGNOSIS — Z3A39 39 weeks gestation of pregnancy: Secondary | ICD-10-CM

## 2015-03-21 DIAGNOSIS — O09523 Supervision of elderly multigravida, third trimester: Secondary | ICD-10-CM

## 2015-03-21 DIAGNOSIS — O0933 Supervision of pregnancy with insufficient antenatal care, third trimester: Secondary | ICD-10-CM

## 2015-03-25 ENCOUNTER — Ambulatory Visit (HOSPITAL_COMMUNITY): Payer: Self-pay

## 2015-03-27 ENCOUNTER — Encounter (HOSPITAL_COMMUNITY): Payer: Self-pay | Admitting: *Deleted

## 2015-03-27 ENCOUNTER — Telehealth (HOSPITAL_COMMUNITY): Payer: Self-pay | Admitting: *Deleted

## 2015-03-27 NOTE — Telephone Encounter (Signed)
Preadmission screen Interpreter number [phone]

## 2015-04-01 ENCOUNTER — Encounter (HOSPITAL_COMMUNITY): Payer: Self-pay | Admitting: *Deleted

## 2015-04-01 ENCOUNTER — Inpatient Hospital Stay (HOSPITAL_COMMUNITY): Admission: RE | Admit: 2015-04-01 | Payer: Self-pay | Source: Ambulatory Visit

## 2015-04-01 ENCOUNTER — Inpatient Hospital Stay (HOSPITAL_COMMUNITY): Payer: Medicaid Other | Admitting: Anesthesiology

## 2015-04-01 ENCOUNTER — Inpatient Hospital Stay (HOSPITAL_COMMUNITY)
Admission: AD | Admit: 2015-04-01 | Discharge: 2015-04-02 | DRG: 775 | Disposition: A | Discharge status: Home / Self Care | Payer: Medicaid Other | Source: Ambulatory Visit | Attending: Family Medicine | Admitting: Family Medicine

## 2015-04-01 DIAGNOSIS — Z3A40 40 weeks gestation of pregnancy: Secondary | ICD-10-CM | POA: Diagnosis present

## 2015-04-01 DIAGNOSIS — O09523 Supervision of elderly multigravida, third trimester: Secondary | ICD-10-CM

## 2015-04-01 DIAGNOSIS — O99824 Streptococcus B carrier state complicating childbirth: Secondary | ICD-10-CM | POA: Diagnosis present

## 2015-04-01 LAB — CBC
HCT: 39.5 % (ref 36.0–46.0)
HEMOGLOBIN: 13.8 g/dL (ref 12.0–15.0)
MCH: 30.5 pg (ref 26.0–34.0)
MCHC: 34.9 g/dL (ref 30.0–36.0)
MCV: 87.2 fL (ref 78.0–100.0)
Platelets: 173 10*3/uL (ref 150–400)
RBC: 4.53 MIL/uL (ref 3.87–5.11)
RDW: 13 % (ref 11.5–15.5)
WBC: 8 10*3/uL (ref 4.0–10.5)

## 2015-04-01 LAB — TYPE AND SCREEN
ABO/RH(D): O POS
Antibody Screen: NEGATIVE

## 2015-04-01 LAB — ABO/RH: ABO/RH(D): O POS

## 2015-04-01 LAB — RPR: RPR Ser Ql: NONREACTIVE

## 2015-04-01 MED ORDER — ONDANSETRON HCL 4 MG PO TABS: 4.0000 mg | ORAL_TABLET | ORAL | Status: DC | PRN

## 2015-04-01 MED ORDER — ACETAMINOPHEN 325 MG PO TABS: 650.0000 mg | ORAL_TABLET | ORAL | Status: DC | PRN

## 2015-04-01 MED ORDER — SENNOSIDES-DOCUSATE SODIUM 8.6-50 MG PO TABS: 2.0000 | ORAL_TABLET | ORAL | Status: DC

## 2015-04-01 MED ORDER — FENTANYL 2.5 MCG/ML BUPIVACAINE 1/10 % EPIDURAL INFUSION (WH - ANES)
14.0000 mL/h | INTRAMUSCULAR | Status: DC | PRN
  Administered 2015-04-01 (×2): 14 mL/h via EPIDURAL
  Filled 2015-04-01: qty 125

## 2015-04-01 MED ORDER — ZOLPIDEM TARTRATE 5 MG PO TABS: 5.0000 mg | ORAL_TABLET | Freq: Every evening | ORAL | Status: DC | PRN

## 2015-04-01 MED ORDER — OXYTOCIN 40 UNITS IN LACTATED RINGERS INFUSION - SIMPLE MED
1.0000 m[IU]/min | INTRAVENOUS | Status: DC
  Administered 2015-04-01: 2 m[IU]/min via INTRAVENOUS

## 2015-04-01 MED ORDER — LACTATED RINGERS IV SOLN
INTRAVENOUS | Status: DC
  Administered 2015-04-01 (×2): via INTRAVENOUS

## 2015-04-01 MED ORDER — OXYCODONE-ACETAMINOPHEN 5-325 MG PO TABS: 2.0000 | ORAL_TABLET | ORAL | Status: DC | PRN

## 2015-04-01 MED ORDER — LANOLIN HYDROUS EX OINT: TOPICAL_OINTMENT | CUTANEOUS | Status: DC | PRN

## 2015-04-01 MED ORDER — WITCH HAZEL-GLYCERIN EX PADS: 1.0000 "application " | MEDICATED_PAD | CUTANEOUS | Status: DC | PRN

## 2015-04-01 MED ORDER — FENTANYL 2.5 MCG/ML BUPIVACAINE 1/10 % EPIDURAL INFUSION (WH - ANES): 14.0000 mL/h | INTRAMUSCULAR | Status: DC | PRN

## 2015-04-01 MED ORDER — SIMETHICONE 80 MG PO CHEW: 80.0000 mg | CHEWABLE_TABLET | ORAL | Status: DC | PRN

## 2015-04-01 MED ORDER — LIDOCAINE HCL (PF) 1 % IJ SOLN
30.0000 mL | INTRAMUSCULAR | Status: DC | PRN
  Filled 2015-04-01: qty 30

## 2015-04-01 MED ORDER — DIBUCAINE 1 % RE OINT: 1.0000 "application " | TOPICAL_OINTMENT | RECTAL | Status: DC | PRN

## 2015-04-01 MED ORDER — OXYTOCIN 40 UNITS IN LACTATED RINGERS INFUSION - SIMPLE MED
62.5000 mL/h | INTRAVENOUS | Status: DC
  Filled 2015-04-01: qty 1000

## 2015-04-01 MED ORDER — ONDANSETRON HCL 4 MG/2ML IJ SOLN: 4.0000 mg | INTRAMUSCULAR | Status: DC | PRN

## 2015-04-01 MED ORDER — PRENATAL MULTIVITAMIN CH
1.0000 | ORAL_TABLET | Freq: Every day | ORAL | Status: DC
  Administered 2015-04-02: 1 via ORAL
  Filled 2015-04-01: qty 1

## 2015-04-01 MED ORDER — PHENYLEPHRINE 40 MCG/ML (10ML) SYRINGE FOR IV PUSH (FOR BLOOD PRESSURE SUPPORT)
80.0000 ug | PREFILLED_SYRINGE | INTRAVENOUS | Status: DC | PRN
  Filled 2015-04-01: qty 20
  Filled 2015-04-01: qty 2

## 2015-04-01 MED ORDER — TETANUS-DIPHTH-ACELL PERTUSSIS 5-2.5-18.5 LF-MCG/0.5 IM SUSP: 0.5000 mL | Freq: Once | INTRAMUSCULAR | Status: DC

## 2015-04-01 MED ORDER — OXYTOCIN BOLUS FROM INFUSION: 500.0000 mL | INTRAVENOUS | Status: DC

## 2015-04-01 MED ORDER — DIPHENHYDRAMINE HCL 50 MG/ML IJ SOLN: 12.5000 mg | INTRAMUSCULAR | Status: DC | PRN

## 2015-04-01 MED ORDER — OXYCODONE-ACETAMINOPHEN 5-325 MG PO TABS: 1.0000 | ORAL_TABLET | ORAL | Status: DC | PRN

## 2015-04-01 MED ORDER — LIDOCAINE HCL (PF) 1 % IJ SOLN
INTRAMUSCULAR | Status: DC | PRN
  Administered 2015-04-01 (×2): 4 mL

## 2015-04-01 MED ORDER — AMPICILLIN SODIUM 2 G IJ SOLR
2.0000 g | Freq: Once | INTRAMUSCULAR | Status: AC
  Administered 2015-04-01: 2 g via INTRAVENOUS
  Filled 2015-04-01: qty 2000

## 2015-04-01 MED ORDER — EPHEDRINE 5 MG/ML INJ
10.0000 mg | INTRAVENOUS | Status: DC | PRN
  Filled 2015-04-01: qty 2

## 2015-04-01 MED ORDER — DIPHENHYDRAMINE HCL 25 MG PO CAPS: 25.0000 mg | ORAL_CAPSULE | Freq: Four times a day (QID) | ORAL | Status: DC | PRN

## 2015-04-01 MED ORDER — BENZOCAINE-MENTHOL 20-0.5 % EX AERO: 1.0000 "application " | INHALATION_SPRAY | CUTANEOUS | Status: DC | PRN

## 2015-04-01 MED ORDER — AMPICILLIN SODIUM 1 G IJ SOLR
1.0000 g | Freq: Four times a day (QID) | INTRAMUSCULAR | Status: DC
  Administered 2015-04-01: 1 g via INTRAVENOUS
  Filled 2015-04-01 (×4): qty 1000

## 2015-04-01 MED ORDER — IBUPROFEN 600 MG PO TABS
600.0000 mg | ORAL_TABLET | Freq: Four times a day (QID) | ORAL | Status: DC
  Administered 2015-04-01 – 2015-04-02 (×4): 600 mg via ORAL
  Filled 2015-04-01 (×4): qty 1

## 2015-04-01 MED ORDER — LACTATED RINGERS IV SOLN: 500.0000 mL | INTRAVENOUS | Status: DC | PRN

## 2015-04-01 MED ORDER — FLEET ENEMA 7-19 GM/118ML RE ENEM: 1.0000 | ENEMA | RECTAL | Status: DC | PRN

## 2015-04-01 MED ORDER — SENNOSIDES-DOCUSATE SODIUM 8.6-50 MG PO TABS
2.0000 | ORAL_TABLET | ORAL | Status: DC
  Administered 2015-04-02: 2 via ORAL
  Filled 2015-04-01: qty 2

## 2015-04-01 MED ORDER — TERBUTALINE SULFATE 1 MG/ML IJ SOLN
0.2500 mg | Freq: Once | INTRAMUSCULAR | Status: DC | PRN
  Filled 2015-04-01: qty 1

## 2015-04-01 MED ORDER — IBUPROFEN 600 MG PO TABS: 600.0000 mg | ORAL_TABLET | Freq: Four times a day (QID) | ORAL | Status: DC

## 2015-04-01 MED ORDER — CITRIC ACID-SODIUM CITRATE 334-500 MG/5ML PO SOLN: 30.0000 mL | ORAL | Status: DC | PRN

## 2015-04-01 MED ORDER — PRENATAL MULTIVITAMIN CH: 1.0000 | ORAL_TABLET | Freq: Every day | ORAL | Status: DC

## 2015-04-01 MED ORDER — ONDANSETRON HCL 4 MG/2ML IJ SOLN: 4.0000 mg | Freq: Four times a day (QID) | INTRAMUSCULAR | Status: DC | PRN

## 2015-04-01 MED ORDER — PHENYLEPHRINE 40 MCG/ML (10ML) SYRINGE FOR IV PUSH (FOR BLOOD PRESSURE SUPPORT)
80.0000 ug | PREFILLED_SYRINGE | INTRAVENOUS | Status: DC | PRN
  Filled 2015-04-01: qty 2

## 2015-04-01 NOTE — MAU Note (Signed)
Pt to be admitted to L&D as per faculty.

## 2015-04-01 NOTE — Anesthesia Postprocedure Evaluation (Signed)
Anesthesia Post Note  Patient: [name]  Procedure(s) Performed: * No procedures listed *  Anesthesia type: Epidural  Patient location: Mother/Baby  Post pain: Pain level controlled  Post assessment: Post-op Vital signs reviewed  Last Vitals:  Filed Vitals:   04/01/15 1730  BP: 116/70  Pulse: 67  Temp: 37.1 C  Resp: 16    Post vital signs: Reviewed  Level of consciousness:alert  Complications: No apparent anesthesia complications

## 2015-04-01 NOTE — Anesthesia Preprocedure Evaluation (Signed)
Anesthesia Evaluation  Patient identified by MRN, date of birth, ID band Patient awake    Reviewed: Allergy & Precautions, H&P , Patient's Chart, lab work & pertinent test results  Airway Mallampati: I  TM Distance: >3 FB Neck ROM: full    Dental no notable dental hx. (+) Teeth Intact   Pulmonary neg pulmonary ROS,  breath sounds clear to auscultation  Pulmonary exam normal       Cardiovascular negative cardio ROS Normal cardiovascular examRhythm:regular Rate:Normal     Neuro/Psych negative neurological ROS  negative psych ROS   GI/Hepatic negative GI ROS, Neg liver ROS,   Endo/Other  negative endocrine ROS  Renal/GU negative Renal ROS  negative genitourinary   Musculoskeletal   Abdominal   Peds  Hematology  (+) anemia ,   Anesthesia Other Findings   Reproductive/Obstetrics (+) Pregnancy                             Anesthesia Physical Anesthesia Plan  ASA: II  Anesthesia Plan: Epidural   Post-op Pain Management:    Induction:   Airway Management Planned:   Additional Equipment:   Intra-op Plan:   Post-operative Plan:   Informed Consent: I have reviewed the patients History and Physical, chart, labs and discussed the procedure including the risks, benefits and alternatives for the proposed anesthesia with the patient or authorized representative who has indicated his/her understanding and acceptance.     Plan Discussed with: Anesthesiologist  Anesthesia Plan Comments:         Anesthesia Quick Evaluation

## 2015-04-01 NOTE — Progress Notes (Signed)
Patient ID: Danielle MoSanjuana Cole, female   DOB: 15-Jun-1971, 44 y.o.   MRN: [id number] Doing well, not very uncomfortable  Filed Vitals:   04/01/15 0056 04/01/15 0103 04/01/15 0154 04/01/15 0507  BP:  122/79 122/79 135/82  Pulse:  76 88 69  Temp:  98.2 F (36.8 C) 98.2 F (36.8 C) 98.6 F (37 C)  TempSrc:  Oral Oral Oral  Resp:  18 18   Height: 5\' 3"  (1.6 m)     Weight: 158 lb 3.2 oz (71.759 kg)       FHR reactive UCs every 7-8 min  Dilation: 2.5 Effacement (%): 50 Cervical Position: Middle Station: -3 Presentation: Vertex Exam by:: TEPP[employer] PartnersDenise Collison RN  Will start Pitocin per protocol, 2x2

## 2015-04-01 NOTE — Progress Notes (Signed)
I assisted RN Judeth CornfieldStephanie ,with some questions, also I ordered patients meals, by Orlan LeavensViria Alvarez Spanish Intepreter

## 2015-04-01 NOTE — Progress Notes (Signed)
Patient ID: Danielle Cole, female   DOB: April 28, 1971, 44 y.o.   MRN: [id number] Doing well  Filed Vitals:   04/01/15 0103 04/01/15 0154 04/01/15 0507 04/01/15 0530  BP: 122/79 122/79 135/82 126/74  Pulse: 76 88 69 64  Temp: 98.2 F (36.8 C) 98.2 F (36.8 C) 98.6 F (37 C)   TempSrc: Oral Oral Oral   Resp: 18 18    Height:      Weight:       FHR reactive Irregular contractions  Dilation: 3 Effacement (%): 70 Cervical Position: Middle Station: -2 Presentation: Vertex Exam by:: [email or web address]-[email or web address], rnc   Will start Pitocin

## 2015-04-01 NOTE — MAU Note (Signed)
Pt to be assessed.

## 2015-04-01 NOTE — Anesthesia Procedure Notes (Signed)
Epidural Patient location during procedure: OB Start time: 04/01/2015 8:43 AM  Staffing Anesthesiologist: JUDD, BENJAMIN Performed by: anesthesiologist   Preanesthetic Checklist Completed: patient identified, site marked, surgical consent, pre-op evaluation, timeout performed, IV checked, risks and benefits discussed and monitors and equipment checked  Epidural Patient position: sitting Prep: site prepped and draped and DuraPrep Patient monitoring: continuous pulse ox and blood pressure Approach: midline Location: L3-L4 Injection technique: LOR saline  Needle:  Needle type: Tuohy  Needle gauge: 17 G Needle length: 9 cm and 9 Needle insertion depth: 4 cm Catheter type: closed end flexible Catheter size: 19 Gauge Catheter at skin depth: 9 cm Test dose: negative and Other  Assessment Events: blood not aspirated, injection not painful, no injection resistance, negative IV test and no paresthesia  Additional Notes Patient identified. Risks and benefits discussed including failed block, incomplete  Pain control, post dural puncture headache, nerve damage, paralysis, blood pressure Changes, nausea, vomiting, reactions to medications-both toxic and allergic and post Partum back pain. All questions were answered. Patient expressed understanding and wished to proceed. Sterile technique was used throughout procedure. Epidural site was Dressed with sterile barrier dressing. No paresthesias, signs of intravascular injection Or signs of intrathecal spread were encountered.  Patient was more comfortable after the epidural was dosed. Please see RN's note for documentation of vital signs and FHR which are stable.

## 2015-04-01 NOTE — H&P (Signed)
Danielle Cole is a 44 y.o. female presenting for SOL.  Patient reports regular contractions q2-5 minutes for that last 2 hours.  +FM, denies LOF.  Reports vaginal bleeding (though seems like bloody show)  Was scheduled for IOL for AMA later this AM.  Boy/br&bo/IUD. Followed in HD.  Maternal Medical History:  Reason for admission: Nausea.    OB History    Gravida Para Term Preterm AB TAB SAB Ectopic Multiple Living   Past Medical History  Diagnosis Date  . Medical history non-contributory   . Anemia     6years ago   Past Surgical History  Procedure Laterality Date  . No past surgeries     Family History: family history includes Cancer in her mother. Social History:  reports that she has never smoked. She has never used smokeless tobacco. She reports that she does not drink alcohol or use illicit drugs.   Prenatal Transfer Tool  Maternal Diabetes: No Genetic Screening: Declined Maternal Ultrasounds/Referrals: Normal Fetal Ultrasounds or other Referrals:  None Maternal Substance Abuse:  No Significant Maternal Medications:  None Significant Maternal Lab Results:  Lab values include: Group B Strep positive Other Comments:  None  Review of Systems  Constitutional: Negative for fever, chills and weight loss.  HENT: Negative.   Eyes: Negative for blurred vision, double vision, photophobia and pain.  Respiratory: Negative for cough, shortness of breath and wheezing.   Cardiovascular: Negative for chest pain and leg swelling.  Gastrointestinal: Negative for heartburn, nausea and vomiting.  Genitourinary: Negative for dysuria, urgency and frequency.  Musculoskeletal: Negative.   Skin: Negative for itching and rash.  Neurological: Negative.  Negative for headaches.  Endo/Heme/Allergies: Negative.   Psychiatric/Behavioral: Negative.     Dilation: 2.5 Effacement (%): 50 Station: -3 Exam by:: [employer] Blood pressure 122/79, pulse 76,  temperature 98.2 F (36.8 C), temperature source Oral, resp. rate 18, height  (1.6 m), weight 158 lb 3.2 oz (71.759 kg), last menstrual period 06/25/2014. Maternal Exam:  Uterine Assessment: Contraction strength is moderate.  Contraction frequency is regular.   Abdomen: Patient reports no abdominal tenderness. Fetal presentation: vertex  Introitus: Normal vulva. Normal vagina.  Pelvis: adequate for delivery.   Cervix: Cervix evaluated by digital exam.     Fetal Exam Fetal Monitor Review: Baseline rate: 120.  Variability: moderate (6-25 bpm).   Pattern: accelerations present and no decelerations.    Fetal State Assessment: Category I - tracings are normal.     Physical Exam  Constitutional: She is oriented to person, place, and time. She appears well-developed and well-nourished. No distress.  HENT:  Head: Normocephalic and atraumatic.  Eyes: Pupils are equal, round, and reactive to light. No scleral icterus.  Neck: Normal range of motion. Neck supple.  Cardiovascular: Normal rate and intact distal pulses.   Respiratory: Effort normal. No respiratory distress.  GI: There is no tenderness. There is no rebound and no guarding.  Gravid  Genitourinary: Vagina normal.  Musculoskeletal: She exhibits no edema or tenderness.  Neurological: She is alert and oriented to person, place, and time.  Skin: Skin is warm and dry. No rash noted.  Psychiatric: She has a normal mood and affect. Her behavior is normal. Thought content normal.    Prenatal labs: ABO, Rh: O/Positive/-- (03/07 0000) Antibody: Negative (03/07 0000) Rubella: Immune (03/07 0000) RPR: Nonreactive (03/07 0000)  HBsAg: Negative (03/07 0000)  HIV: Non-reactive (03/07 0000)  GBS: Positive (06/13 0000)   Assessment/Plan: Danielle Cole is a 44 y.o. G4P3003 at 396w0d here for SOL.  #Labor: expectant management, consider augmentation with pitocin if contractions slow #Pain: Patient declines pain medications  currently #FWB: Cat 1 #ID:  GBS pos - ampicillin ordered #MOF: Br & bo #MOC: desires IUD   Erasmo DownerAngela M Bacigalupo, MD, MPH PGY-2,  [facility] 04/01/2015 1:41 AM  Seen and examined by me also She may be in early labor but since she is scheduled for IOL this morning, will admit Will start Pitocin [employer] SignsMarie L Williams, CNM

## 2015-04-02 ENCOUNTER — Ambulatory Visit: Payer: Self-pay

## 2015-04-02 MED ORDER — IBUPROFEN 600 MG PO TABS: 600.0000 mg | ORAL_TABLET | Freq: Four times a day (QID) | ORAL | Status: AC

## 2015-04-02 NOTE — Lactation Note (Signed)
This note was copied from the chart of Danielle Cole Kazmierczak. Lactation Consultation Note Initial visit at 27 hours of age with spanish interpreter.  Mom reports not having milk and not seeing with hand expression.  Mom is supplementing with formula in a bottle.  Discussed supply and demand and supplementing affecting milk supply.  Assisted with hand expression and not colostrum noted after several minutes.  Encouraged mom to breast feed for every feeding and offer small amounts of formula as desired.  Encouraged mom to breastfeed at least 8 times in 24 hours.   [facility] given and discussed.  Encouraged to feed with early cues on demand.  Early newborn behavior discussed.   Mom to call for assist as needed.    Patient Name: Danielle Cole Weldin ZOXWR'UToday's Date: 04/02/2015 Reason for consult: Initial assessment   Maternal Data Has patient been taught Hand Expression?: Yes Does the patient have breastfeeding experience prior to this delivery?: Yes  Feeding Feeding Type: Breast Fed Length of feed:  (several minutes)  LATCH Score/Interventions Latch: Grasps breast easily, tongue down, lips flanged, rhythmical sucking.  Audible Swallowing: A few with stimulation  Type of Nipple: Everted at rest and after stimulation  Comfort (Breast/Nipple): Soft / non-tender     Hold (Positioning): Assistance needed to correctly position infant at breast and maintain latch. Intervention(s): Breastfeeding basics reviewed;Support Pillows;Position options;Skin to skin  LATCH Score: 8  Lactation Tools Discussed/Used WIC Program: No   Consult Status Consult Status: Follow-up Date: 04/03/15 Follow-up type: In-patient    Shoptaw, Arvella MerlesJana Lynn 04/02/2015, 6:06 PM

## 2015-04-02 NOTE — Discharge Instructions (Signed)

## 2015-04-02 NOTE — Discharge Summary (Signed)
Obstetric Discharge Summary Reason for Admission: onset of labor Prenatal Procedures: NST Intrapartum Procedures: spontaneous vaginal delivery Postpartum Procedures: none Complications-Operative and Postpartum: none HEMOGLOBIN  Date Value Ref Range Status  04/01/2015 13.8 12.0 - 15.0 g/dL Final   HCT  Date Value Ref Range Status  04/01/2015 39.5 36.0 - 46.0 % Final  Cole Course:  Expand All Collapse All   Danielle FeltSanjuana Ardyth Cole is a 44 y.o. female presenting for SOL. Patient reports regular contractions q2-5 minutes for that last 2 hours. +FM, denies LOF. Reports vaginal bleeding (though seems like bloody show)  Was scheduled for IOL for AMA later this AM. Boy/br&bo/IUD. Followed in HD.      Delivery Note At 02:16 PM a viable female was delivered via Vaginal, Spontaneous Delivery (Presentation: VERTEX). APGAR: 9, 9; weight 7-14.  Placenta status: Intact, Spontaneous. Cord: 3 vessels with the following complications: None. Cord pH: NA.  Anesthesia: Epidural  Episiotomy: None Lacerations: None Suture Repair: N/A Est. Blood Loss (mL): 258 ml   Mom to postpartum. Baby to Couplet care / Skin to Skin. Placenta to: Birthing Suites Feeding: Breast & Bottle Circ: NA Contraception: IUD         Has done well postpartum No pain, Little bleeding wants to go home early this afternoon. Will have her discuss it with pediatrician    Physical Exam:  General: alert, cooperative and no distress Lochia: appropriate Uterine Fundus: firm Incision: healing well DVT Evaluation: No evidence of DVT seen on physical exam.  Discharge Diagnoses: Term Pregnancy-delivered  Discharge Information: Date: 04/02/2015 Activity: unrestricted and pelvic rest Diet: routine Medications: PNV and Ibuprofen Condition: stable and improved Instructions: refer to practice specific booklet Discharge to: home   Newborn Data: Live born female  Birth Weight: 7 lb 14.8 oz (3595 g) APGAR: 9,  9  Home with mother.  [facility] HospitalWILLIAMS,Daundre Cole 04/02/2015, 7:52 AM

## 2020-01-19 ENCOUNTER — Ambulatory Visit: Payer: Self-pay | Attending: Internal Medicine

## 2020-01-19 DIAGNOSIS — Z23 Encounter for immunization: Secondary | ICD-10-CM

## 2020-01-19 NOTE — Progress Notes (Signed)
   Covid-19 Vaccination Clinic  Name:  Danielle Cole    MRN: [id number] DOB: September 06, 1971  01/19/2020  [name] was observed post Covid-19 immunization for 15 minutes without incident. She was provided with Vaccine Information Sheet and instruction to access the V-Safe system.   [name] was instructed to call 911 with any severe reactions post vaccine: Marland Kitchen Difficulty breathing  . Swelling of face and throat  . A fast heartbeat  . A bad rash all over body  . Dizziness and weakness   Immunizations Administered    Name Date Dose VIS Date Route   Pfizer COVID-19 Vaccine 01/19/2020  1:21 PM 0.3 mL 11/21/2018 Intramuscular   Manufacturer: [employer], [employer]   Lot: [id number]   NDC: [address]-7

## 2020-02-11 ENCOUNTER — Ambulatory Visit: Payer: Self-pay | Attending: Internal Medicine

## 2020-02-11 DIAGNOSIS — Z23 Encounter for immunization: Secondary | ICD-10-CM

## 2020-02-11 NOTE — Progress Notes (Signed)
   Covid-19 Vaccination Clinic  Name:  Tahtiana Rozier    MRN: [id number] DOB: 1971/06/08  02/11/2020  Ms. Nazaryan was observed post Covid-19 immunization for 15 minutes without incident. She was provided with Vaccine Information Sheet and instruction to access the V-Safe system.   Ms. Monterrosa was instructed to call 911 with any severe reactions post vaccine: Marland Kitchen Difficulty breathing  . Swelling of face and throat  . A fast heartbeat  . A bad rash all over body  . Dizziness and weakness   Immunizations Administered    Name Date Dose VIS Date Route   Pfizer COVID-19 Vaccine 02/11/2020  4:28 PM 0.3 mL 11/21/2018 Intramuscular   Manufacturer: [employer], [employer]   Lot: [id number]   NDC: [address]-2

## 2021-06-15 ENCOUNTER — Other Ambulatory Visit: Payer: Self-pay | Admitting: Obstetrics & Gynecology

## 2021-06-15 DIAGNOSIS — Z1231 Encounter for screening mammogram for malignant neoplasm of breast: Secondary | ICD-10-CM

## 2021-06-18 ENCOUNTER — Other Ambulatory Visit: Payer: Self-pay | Admitting: Obstetrics & Gynecology

## 2021-06-18 DIAGNOSIS — Z1231 Encounter for screening mammogram for malignant neoplasm of breast: Secondary | ICD-10-CM

## 2021-06-30 ENCOUNTER — Other Ambulatory Visit: Payer: Self-pay

## 2021-06-30 ENCOUNTER — Ambulatory Visit
Admission: RE | Admit: 2021-06-30 | Discharge: 2021-06-30 | Disposition: A | Discharge status: Home / Self Care | Payer: No Typology Code available for payment source | Source: Ambulatory Visit | Attending: Obstetrics & Gynecology | Admitting: Obstetrics & Gynecology

## 2021-06-30 DIAGNOSIS — Z1231 Encounter for screening mammogram for malignant neoplasm of breast: Secondary | ICD-10-CM

## 2021-06-30 IMAGING — MG MM DIGITAL SCREENING BILAT W/ TOMO AND CAD
8 series · 9 of 24 positions shown · non-contrast
Comparison: None.

CLINICAL DATA: Screening.

EXAM:
DIGITAL SCREENING BILATERAL MAMMOGRAM WITH TOMOSYNTHESIS AND CAD
TECHNIQUE: Bilateral screening digital craniocaudal and mediolateral oblique
mammograms were obtained. Bilateral screening digital breast
tomosynthesis was performed. The images were evaluated with
computer-aided detection.

[R CC synth-2D]
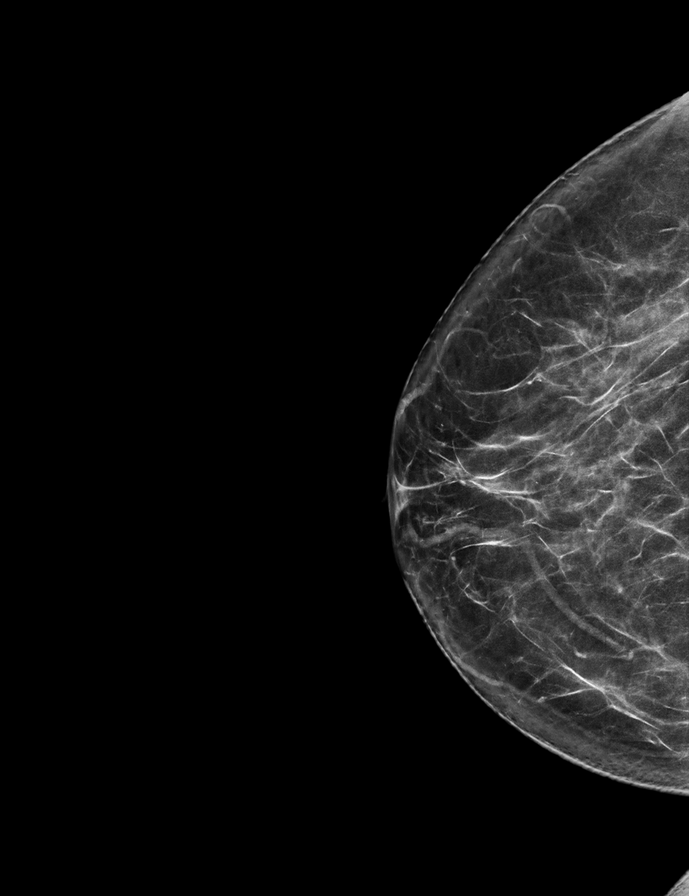

[L MLO synth-2D]
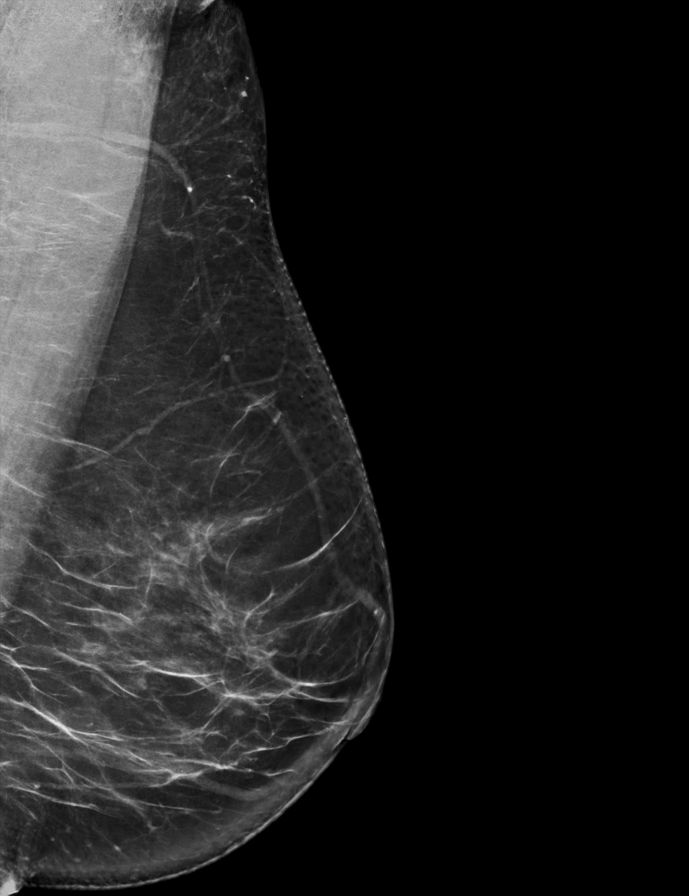

[R MLO synth-2D]
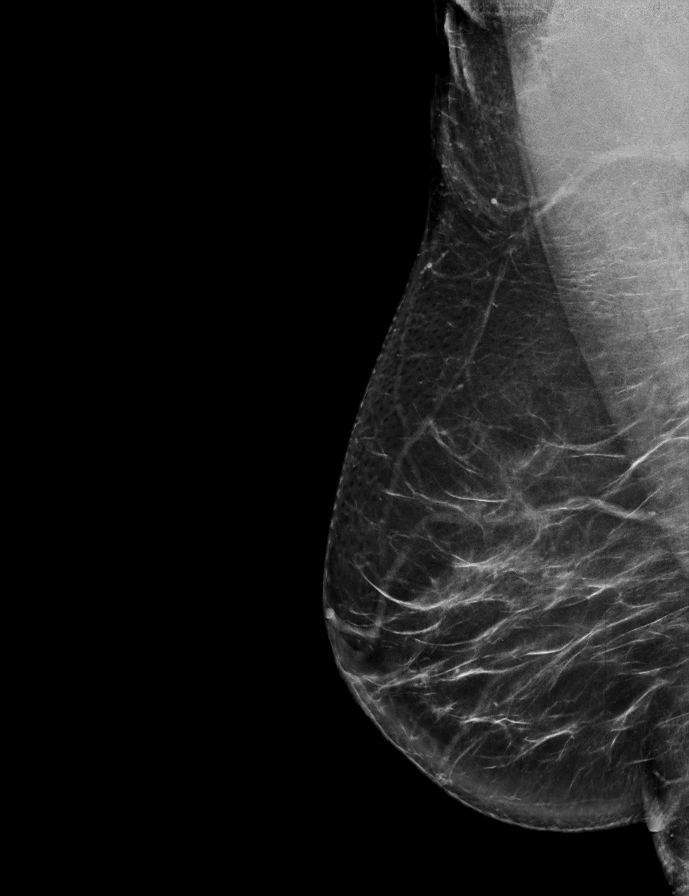

[L CC synth-2D]
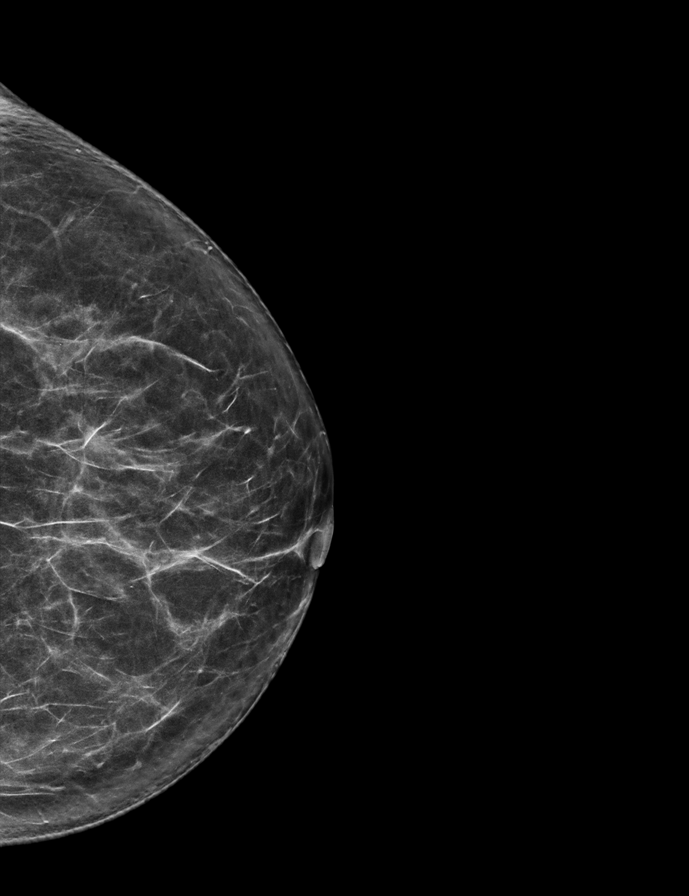

[L CC tomo · 2 of 68 frames shown]
[frame 22/68]
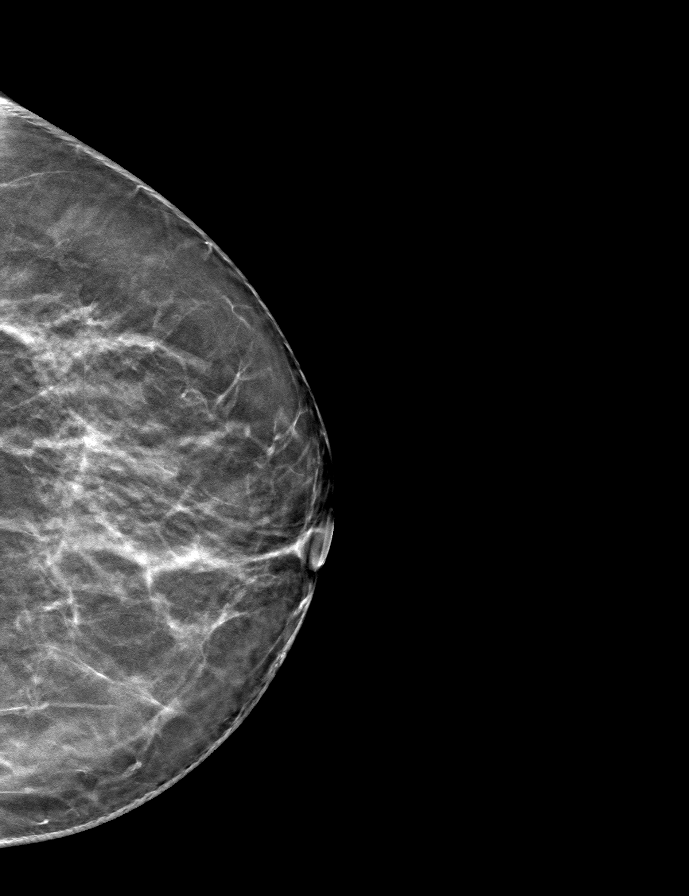
[frame 35/68]
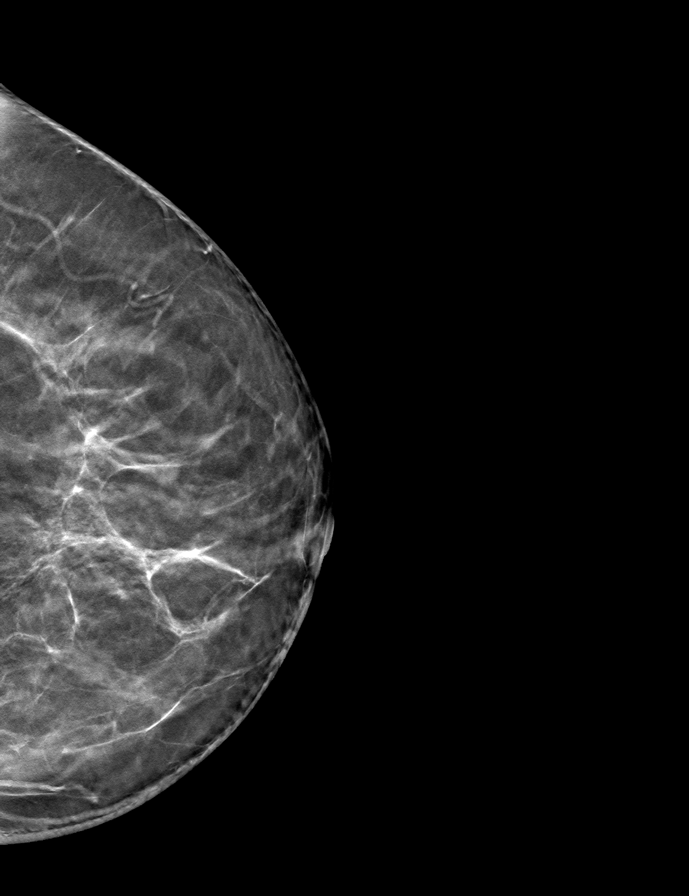

[R CC tomo · tomo slice 33/65.0]
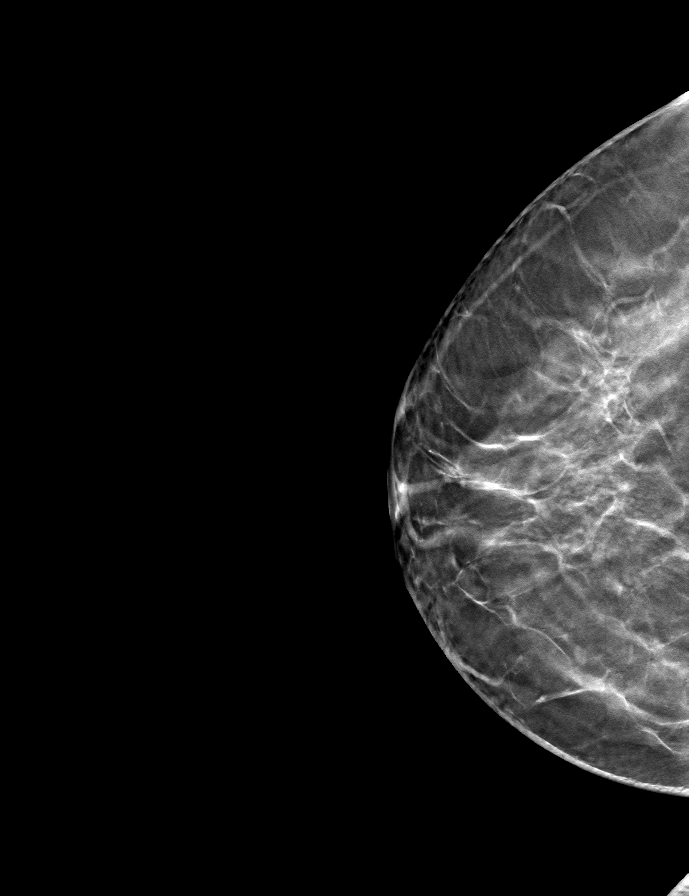

[L MLO tomo · tomo slice 39/78.0]
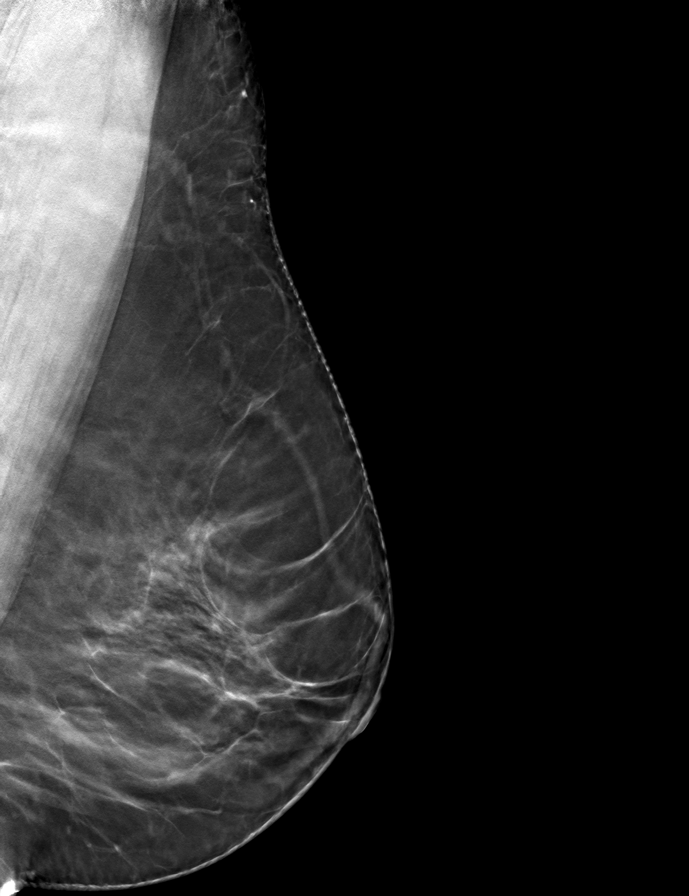

[R MLO tomo · tomo slice 39/76.0]
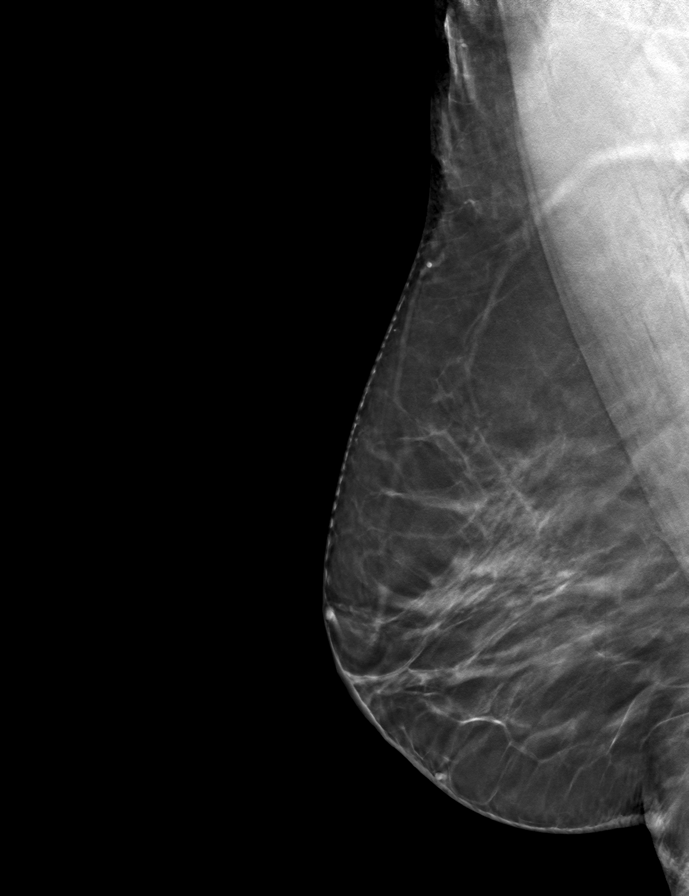

[9 of 24 positions shown; findings below may reference images not displayed]

ACR Breast Density Category b: There are scattered areas of
fibroglandular density.
FINDINGS: There are no findings suspicious for malignancy.
IMPRESSION: No mammographic evidence of malignancy. A result letter of this
screening mammogram will be mailed directly to the patient.

RECOMMENDATION:
Screening mammogram in one year. (Code:XG-X-[id number])

BI-RADS CATEGORY  1: Negative.
